# Patient Record
Sex: Male | Born: 1935 | Hispanic: Refuse to answer | Marital: Married | Smoking: Current every day smoker
Health system: Southern US, Community
[De-identification: ages and names within clinical notes are randomized; demographics above are authoritative.]

## PROBLEM LIST (undated history)

## (undated) DIAGNOSIS — Z9889 Other specified postprocedural states: Secondary | ICD-10-CM

## (undated) DIAGNOSIS — Z8679 Personal history of other diseases of the circulatory system: Secondary | ICD-10-CM

## (undated) DIAGNOSIS — E538 Deficiency of other specified B group vitamins: Secondary | ICD-10-CM

## (undated) DIAGNOSIS — J449 Chronic obstructive pulmonary disease, unspecified: Secondary | ICD-10-CM

## (undated) DIAGNOSIS — Z720 Tobacco use: Secondary | ICD-10-CM

## (undated) DIAGNOSIS — I10 Essential (primary) hypertension: Secondary | ICD-10-CM

## (undated) DIAGNOSIS — I509 Heart failure, unspecified: Secondary | ICD-10-CM

## (undated) HISTORY — PX: CHOLECYSTECTOMY: SHX55

## (undated) HISTORY — PX: PACEMAKER INSERTION: SHX728

---

## 2003-11-05 HISTORY — PX: OTHER SURGICAL HISTORY: SHX169

## 2004-07-18 ENCOUNTER — Other Ambulatory Visit: Payer: Self-pay

## 2008-01-03 ENCOUNTER — Ambulatory Visit: Payer: Self-pay | Admitting: Internal Medicine

## 2008-01-20 ENCOUNTER — Ambulatory Visit: Payer: Self-pay | Admitting: Internal Medicine

## 2008-02-03 ENCOUNTER — Ambulatory Visit: Payer: Self-pay | Admitting: Internal Medicine

## 2008-03-04 ENCOUNTER — Ambulatory Visit: Payer: Self-pay | Admitting: Internal Medicine

## 2008-10-25 ENCOUNTER — Ambulatory Visit: Payer: Self-pay | Admitting: Cardiology

## 2008-10-26 ENCOUNTER — Ambulatory Visit: Payer: Self-pay | Admitting: Cardiology

## 2009-04-11 ENCOUNTER — Ambulatory Visit: Payer: Self-pay | Admitting: Internal Medicine

## 2009-11-04 HISTORY — PX: ABDOMINAL AORTIC ANEURYSM REPAIR: SUR1152

## 2009-11-04 HISTORY — PX: OTHER SURGICAL HISTORY: SHX169

## 2010-04-19 ENCOUNTER — Ambulatory Visit: Payer: Self-pay | Admitting: Internal Medicine

## 2010-05-04 ENCOUNTER — Ambulatory Visit: Payer: Self-pay | Admitting: Vascular Surgery

## 2010-05-18 ENCOUNTER — Inpatient Hospital Stay: Payer: Self-pay | Admitting: Vascular Surgery

## 2010-06-28 ENCOUNTER — Ambulatory Visit: Payer: Self-pay | Admitting: Vascular Surgery

## 2010-07-04 ENCOUNTER — Ambulatory Visit: Payer: Self-pay | Admitting: Cardiovascular Disease

## 2010-08-22 ENCOUNTER — Ambulatory Visit: Payer: Self-pay | Admitting: Vascular Surgery

## 2010-08-22 ENCOUNTER — Ambulatory Visit: Payer: Self-pay | Admitting: Cardiology

## 2010-09-05 ENCOUNTER — Inpatient Hospital Stay: Payer: Self-pay | Admitting: Vascular Surgery

## 2012-03-11 ENCOUNTER — Ambulatory Visit: Payer: Self-pay | Admitting: Oncology

## 2013-11-04 HISTORY — PX: OTHER SURGICAL HISTORY: SHX169

## 2015-01-03 DIAGNOSIS — I2510 Atherosclerotic heart disease of native coronary artery without angina pectoris: Secondary | ICD-10-CM | POA: Diagnosis not present

## 2015-01-03 DIAGNOSIS — D693 Immune thrombocytopenic purpura: Secondary | ICD-10-CM | POA: Diagnosis not present

## 2015-01-03 DIAGNOSIS — M109 Gout, unspecified: Secondary | ICD-10-CM | POA: Diagnosis not present

## 2015-01-03 DIAGNOSIS — Z125 Encounter for screening for malignant neoplasm of prostate: Secondary | ICD-10-CM | POA: Diagnosis not present

## 2015-01-03 DIAGNOSIS — Z0000 Encounter for general adult medical examination without abnormal findings: Secondary | ICD-10-CM | POA: Diagnosis not present

## 2015-01-03 DIAGNOSIS — Z23 Encounter for immunization: Secondary | ICD-10-CM | POA: Diagnosis not present

## 2015-01-03 DIAGNOSIS — E782 Mixed hyperlipidemia: Secondary | ICD-10-CM | POA: Diagnosis not present

## 2015-01-03 DIAGNOSIS — Z720 Tobacco use: Secondary | ICD-10-CM | POA: Diagnosis not present

## 2015-01-03 DIAGNOSIS — I10 Essential (primary) hypertension: Secondary | ICD-10-CM | POA: Diagnosis not present

## 2015-01-05 DIAGNOSIS — I495 Sick sinus syndrome: Secondary | ICD-10-CM | POA: Diagnosis not present

## 2015-01-05 DIAGNOSIS — I6523 Occlusion and stenosis of bilateral carotid arteries: Secondary | ICD-10-CM | POA: Diagnosis not present

## 2015-01-05 DIAGNOSIS — I5022 Chronic systolic (congestive) heart failure: Secondary | ICD-10-CM | POA: Diagnosis not present

## 2015-01-05 DIAGNOSIS — I119 Hypertensive heart disease without heart failure: Secondary | ICD-10-CM | POA: Diagnosis not present

## 2015-05-30 DIAGNOSIS — I714 Abdominal aortic aneurysm, without rupture: Secondary | ICD-10-CM | POA: Diagnosis not present

## 2015-05-30 DIAGNOSIS — I25118 Atherosclerotic heart disease of native coronary artery with other forms of angina pectoris: Secondary | ICD-10-CM | POA: Diagnosis not present

## 2015-05-30 DIAGNOSIS — Z950 Presence of cardiac pacemaker: Secondary | ICD-10-CM | POA: Diagnosis not present

## 2015-05-30 DIAGNOSIS — I5022 Chronic systolic (congestive) heart failure: Secondary | ICD-10-CM | POA: Diagnosis not present

## 2015-05-30 DIAGNOSIS — I517 Cardiomegaly: Secondary | ICD-10-CM | POA: Diagnosis not present

## 2015-05-30 DIAGNOSIS — R05 Cough: Secondary | ICD-10-CM | POA: Diagnosis not present

## 2015-05-30 DIAGNOSIS — R093 Abnormal sputum: Secondary | ICD-10-CM | POA: Diagnosis not present

## 2015-06-09 DIAGNOSIS — I5022 Chronic systolic (congestive) heart failure: Secondary | ICD-10-CM | POA: Diagnosis not present

## 2015-06-09 DIAGNOSIS — I25118 Atherosclerotic heart disease of native coronary artery with other forms of angina pectoris: Secondary | ICD-10-CM | POA: Diagnosis not present

## 2015-06-15 DIAGNOSIS — I5022 Chronic systolic (congestive) heart failure: Secondary | ICD-10-CM | POA: Diagnosis not present

## 2015-06-15 DIAGNOSIS — I313 Pericardial effusion (noninflammatory): Secondary | ICD-10-CM | POA: Diagnosis not present

## 2015-06-15 DIAGNOSIS — I25118 Atherosclerotic heart disease of native coronary artery with other forms of angina pectoris: Secondary | ICD-10-CM | POA: Diagnosis not present

## 2015-06-21 DIAGNOSIS — I5022 Chronic systolic (congestive) heart failure: Secondary | ICD-10-CM | POA: Diagnosis not present

## 2015-06-21 DIAGNOSIS — I25118 Atherosclerotic heart disease of native coronary artery with other forms of angina pectoris: Secondary | ICD-10-CM | POA: Diagnosis not present

## 2015-06-21 DIAGNOSIS — I495 Sick sinus syndrome: Secondary | ICD-10-CM | POA: Diagnosis not present

## 2015-08-07 ENCOUNTER — Encounter: Payer: Self-pay | Admitting: Internal Medicine

## 2015-08-07 ENCOUNTER — Other Ambulatory Visit: Payer: Self-pay | Admitting: Internal Medicine

## 2015-08-07 DIAGNOSIS — M169 Osteoarthritis of hip, unspecified: Secondary | ICD-10-CM | POA: Insufficient documentation

## 2015-08-07 DIAGNOSIS — M109 Gout, unspecified: Secondary | ICD-10-CM | POA: Insufficient documentation

## 2015-08-07 DIAGNOSIS — D693 Immune thrombocytopenic purpura: Secondary | ICD-10-CM | POA: Insufficient documentation

## 2015-08-07 DIAGNOSIS — K220 Achalasia of cardia: Secondary | ICD-10-CM | POA: Insufficient documentation

## 2015-08-07 DIAGNOSIS — R3129 Other microscopic hematuria: Secondary | ICD-10-CM

## 2015-08-07 DIAGNOSIS — E785 Hyperlipidemia, unspecified: Secondary | ICD-10-CM

## 2015-08-07 DIAGNOSIS — F17200 Nicotine dependence, unspecified, uncomplicated: Secondary | ICD-10-CM | POA: Insufficient documentation

## 2015-08-07 DIAGNOSIS — I10 Essential (primary) hypertension: Secondary | ICD-10-CM | POA: Insufficient documentation

## 2015-08-07 DIAGNOSIS — D692 Other nonthrombocytopenic purpura: Secondary | ICD-10-CM | POA: Insufficient documentation

## 2015-08-07 DIAGNOSIS — I2510 Atherosclerotic heart disease of native coronary artery without angina pectoris: Secondary | ICD-10-CM | POA: Insufficient documentation

## 2015-09-15 ENCOUNTER — Other Ambulatory Visit: Payer: Self-pay | Admitting: Internal Medicine

## 2016-01-31 ENCOUNTER — Other Ambulatory Visit: Payer: Self-pay | Admitting: Internal Medicine

## 2016-02-22 ENCOUNTER — Other Ambulatory Visit: Payer: Self-pay | Admitting: Internal Medicine

## 2016-03-19 ENCOUNTER — Other Ambulatory Visit: Payer: Self-pay | Admitting: Internal Medicine

## 2016-09-18 ENCOUNTER — Ambulatory Visit (INDEPENDENT_AMBULATORY_CARE_PROVIDER_SITE_OTHER): Payer: Self-pay | Admitting: Vascular Surgery

## 2016-09-18 ENCOUNTER — Other Ambulatory Visit (INDEPENDENT_AMBULATORY_CARE_PROVIDER_SITE_OTHER): Payer: Self-pay

## 2016-09-18 ENCOUNTER — Encounter (INDEPENDENT_AMBULATORY_CARE_PROVIDER_SITE_OTHER): Payer: Self-pay

## 2017-07-17 ENCOUNTER — Other Ambulatory Visit: Payer: Self-pay | Admitting: Internal Medicine

## 2017-07-17 DIAGNOSIS — R1313 Dysphagia, pharyngeal phase: Secondary | ICD-10-CM

## 2017-08-18 ENCOUNTER — Ambulatory Visit: Payer: Self-pay | Attending: Internal Medicine

## 2017-11-19 ENCOUNTER — Ambulatory Visit
Admission: RE | Admit: 2017-11-19 | Discharge: 2017-11-19 | Disposition: A | Discharge status: Home / Self Care | Payer: Medicare Other | Source: Ambulatory Visit | Attending: Internal Medicine | Admitting: Internal Medicine

## 2017-11-19 ENCOUNTER — Other Ambulatory Visit: Payer: Self-pay | Admitting: Internal Medicine

## 2017-11-19 DIAGNOSIS — R600 Localized edema: Secondary | ICD-10-CM

## 2017-11-21 ENCOUNTER — Emergency Department: Payer: Medicare Other

## 2017-11-21 ENCOUNTER — Observation Stay: Payer: Medicare Other

## 2017-11-21 ENCOUNTER — Observation Stay
Admission: EM | Admit: 2017-11-21 | Discharge: 2017-11-22 | Disposition: A | Discharge status: Home / Self Care | Payer: Medicare Other | Attending: Internal Medicine | Admitting: Internal Medicine

## 2017-11-21 ENCOUNTER — Other Ambulatory Visit: Payer: Self-pay

## 2017-11-21 DIAGNOSIS — Z7982 Long term (current) use of aspirin: Secondary | ICD-10-CM | POA: Diagnosis not present

## 2017-11-21 DIAGNOSIS — M21332 Wrist drop, left wrist: Secondary | ICD-10-CM | POA: Insufficient documentation

## 2017-11-21 DIAGNOSIS — G5632 Lesion of radial nerve, left upper limb: Secondary | ICD-10-CM | POA: Diagnosis not present

## 2017-11-21 DIAGNOSIS — E43 Unspecified severe protein-calorie malnutrition: Secondary | ICD-10-CM | POA: Diagnosis not present

## 2017-11-21 DIAGNOSIS — M1610 Unilateral primary osteoarthritis, unspecified hip: Secondary | ICD-10-CM | POA: Insufficient documentation

## 2017-11-21 DIAGNOSIS — R29898 Other symptoms and signs involving the musculoskeletal system: Secondary | ICD-10-CM | POA: Insufficient documentation

## 2017-11-21 DIAGNOSIS — I110 Hypertensive heart disease with heart failure: Secondary | ICD-10-CM | POA: Insufficient documentation

## 2017-11-21 DIAGNOSIS — R531 Weakness: Secondary | ICD-10-CM | POA: Diagnosis present

## 2017-11-21 DIAGNOSIS — F0390 Unspecified dementia without behavioral disturbance: Secondary | ICD-10-CM | POA: Insufficient documentation

## 2017-11-21 DIAGNOSIS — Z79899 Other long term (current) drug therapy: Secondary | ICD-10-CM | POA: Diagnosis not present

## 2017-11-21 DIAGNOSIS — E119 Type 2 diabetes mellitus without complications: Secondary | ICD-10-CM | POA: Diagnosis not present

## 2017-11-21 DIAGNOSIS — I2510 Atherosclerotic heart disease of native coronary artery without angina pectoris: Secondary | ICD-10-CM | POA: Diagnosis not present

## 2017-11-21 DIAGNOSIS — Z950 Presence of cardiac pacemaker: Secondary | ICD-10-CM | POA: Diagnosis not present

## 2017-11-21 DIAGNOSIS — Z6822 Body mass index (BMI) 22.0-22.9, adult: Secondary | ICD-10-CM | POA: Insufficient documentation

## 2017-11-21 DIAGNOSIS — R200 Anesthesia of skin: Secondary | ICD-10-CM | POA: Insufficient documentation

## 2017-11-21 DIAGNOSIS — J449 Chronic obstructive pulmonary disease, unspecified: Secondary | ICD-10-CM | POA: Diagnosis not present

## 2017-11-21 DIAGNOSIS — R29701 NIHSS score 1: Secondary | ICD-10-CM | POA: Diagnosis not present

## 2017-11-21 DIAGNOSIS — E785 Hyperlipidemia, unspecified: Secondary | ICD-10-CM | POA: Diagnosis not present

## 2017-11-21 DIAGNOSIS — Z8249 Family history of ischemic heart disease and other diseases of the circulatory system: Secondary | ICD-10-CM | POA: Insufficient documentation

## 2017-11-21 DIAGNOSIS — I4892 Unspecified atrial flutter: Secondary | ICD-10-CM | POA: Diagnosis not present

## 2017-11-21 DIAGNOSIS — Z955 Presence of coronary angioplasty implant and graft: Secondary | ICD-10-CM | POA: Insufficient documentation

## 2017-11-21 DIAGNOSIS — Z794 Long term (current) use of insulin: Secondary | ICD-10-CM | POA: Insufficient documentation

## 2017-11-21 DIAGNOSIS — I639 Cerebral infarction, unspecified: Principal | ICD-10-CM

## 2017-11-21 HISTORY — DX: Chronic obstructive pulmonary disease, unspecified: J44.9

## 2017-11-21 HISTORY — DX: Deficiency of other specified B group vitamins: E53.8

## 2017-11-21 HISTORY — DX: Other specified postprocedural states: Z98.890

## 2017-11-21 HISTORY — DX: Personal history of other diseases of the circulatory system: Z86.79

## 2017-11-21 HISTORY — DX: Tobacco use: Z72.0

## 2017-11-21 HISTORY — DX: Heart failure, unspecified: I50.9

## 2017-11-21 HISTORY — DX: Essential (primary) hypertension: I10

## 2017-11-21 LAB — GLUCOSE, CAPILLARY
GLUCOSE-CAPILLARY: 110 mg/dL — AB (ref 65–99)
GLUCOSE-CAPILLARY: 91 mg/dL (ref 65–99)
GLUCOSE-CAPILLARY: 130 mg/dL — AB (ref 65–99)

## 2017-11-21 LAB — DIFFERENTIAL
BASOS ABS: 0 10*3/uL (ref 0–0.1)
BASOS PCT: 1 %
Eosinophils Absolute: 0.1 10*3/uL (ref 0–0.7)
Eosinophils Relative: 2 %
LYMPHS PCT: 17 %
Lymphs Abs: 0.6 10*3/uL — ABNORMAL LOW (ref 1.0–3.6)
MONOS PCT: 6 %
Monocytes Absolute: 0.2 10*3/uL (ref 0.2–1.0)
NEUTROS ABS: 2.7 10*3/uL (ref 1.4–6.5)
Neutrophils Relative %: 74 %

## 2017-11-21 LAB — COMPREHENSIVE METABOLIC PANEL
ALK PHOS: 88 U/L (ref 38–126)
ALT: 12 U/L — AB (ref 17–63)
AST: 26 U/L (ref 15–41)
Albumin: 3.2 g/dL — ABNORMAL LOW (ref 3.5–5.0)
Anion gap: 13 (ref 5–15)
BUN: 15 mg/dL (ref 6–20)
CALCIUM: 8.6 mg/dL — AB (ref 8.9–10.3)
CHLORIDE: 98 mmol/L — AB (ref 101–111)
CO2: 30 mmol/L (ref 22–32)
CREATININE: 1.17 mg/dL (ref 0.61–1.24)
GFR calc Af Amer: >60 mL/min (ref >60)
GFR calc non Af Amer: 55 mL/min — ABNORMAL LOW (ref >60)
Glucose, Bld: 119 mg/dL — ABNORMAL HIGH (ref 65–99)
Potassium: 3 mmol/L — ABNORMAL LOW (ref 3.5–5.1)
SODIUM: 141 mmol/L (ref 135–145)
Total Bilirubin: 1.4 mg/dL — ABNORMAL HIGH (ref 0.3–1.2)
Total Protein: 6.3 g/dL — ABNORMAL LOW (ref 6.5–8.1)

## 2017-11-21 LAB — HEMOGLOBIN A1C
Hgb A1c MFr Bld: 5.5 % (ref 4.8–5.6)
Mean Plasma Glucose: 111.15 mg/dL

## 2017-11-21 LAB — URINALYSIS, ROUTINE W REFLEX MICROSCOPIC
BACTERIA UA: NONE SEEN
Bilirubin Urine: NEGATIVE
Glucose, UA: NEGATIVE mg/dL
KETONES UR: NEGATIVE mg/dL
Leukocytes, UA: NEGATIVE
Nitrite: NEGATIVE
Protein, ur: NEGATIVE mg/dL
SQUAMOUS EPITHELIAL / LPF: NONE SEEN
Specific Gravity, Urine: 1.005 (ref 1.005–1.030)
pH: 7 (ref 5.0–8.0)

## 2017-11-21 LAB — CBC
HEMATOCRIT: 44.7 % (ref 40.0–52.0)
Hemoglobin: 14.8 g/dL (ref 13.0–18.0)
MCH: 31.6 pg (ref 26.0–34.0)
MCHC: 33.1 g/dL (ref 32.0–36.0)
MCV: 95.3 fL (ref 80.0–100.0)
PLATELETS: 86 10*3/uL — AB (ref 150–440)
RBC: 4.68 MIL/uL (ref 4.40–5.90)
RDW: 13.8 % (ref 11.5–14.5)
WBC: 3.7 10*3/uL — AB (ref 3.8–10.6)

## 2017-11-21 LAB — PROTIME-INR
INR: 1.01
Prothrombin Time: 13.2 seconds (ref 11.4–15.2)

## 2017-11-21 LAB — TROPONIN I: Troponin I: <0.03 ng/mL (ref <0.03)

## 2017-11-21 LAB — APTT: APTT: 27 s (ref 24–36)

## 2017-11-21 MED ORDER — GABAPENTIN 300 MG PO CAPS
300.0000 mg | ORAL_CAPSULE | Freq: Two times a day (BID) | ORAL | Status: DC
  Administered 2017-11-21 – 2017-11-22 (×3): 300 mg via ORAL
  Filled 2017-11-21 (×3): qty 1

## 2017-11-21 MED ORDER — ADULT MULTIVITAMIN W/MINERALS CH
1.0000 | ORAL_TABLET | Freq: Every day | ORAL | Status: DC
  Administered 2017-11-22: 1 via ORAL
  Filled 2017-11-21: qty 1

## 2017-11-21 MED ORDER — INSULIN ASPART 100 UNIT/ML ~~LOC~~ SOLN: 0.0000 [IU] | Freq: Every day | SUBCUTANEOUS | Status: DC

## 2017-11-21 MED ORDER — ENOXAPARIN SODIUM 40 MG/0.4ML ~~LOC~~ SOLN
40.0000 mg | SUBCUTANEOUS | Status: DC
  Administered 2017-11-21: 40 mg via SUBCUTANEOUS
  Filled 2017-11-21: qty 0.4

## 2017-11-21 MED ORDER — VITAMIN C 500 MG PO TABS
500.0000 mg | ORAL_TABLET | Freq: Two times a day (BID) | ORAL | Status: DC
  Administered 2017-11-22: 500 mg via ORAL
  Filled 2017-11-21 (×3): qty 1

## 2017-11-21 MED ORDER — TRAMADOL HCL 50 MG PO TABS
50.0000 mg | ORAL_TABLET | Freq: Four times a day (QID) | ORAL | Status: DC | PRN
  Administered 2017-11-22: 50 mg via ORAL
  Filled 2017-11-21: qty 1

## 2017-11-21 MED ORDER — INSULIN ASPART 100 UNIT/ML ~~LOC~~ SOLN: 0.0000 [IU] | Freq: Three times a day (TID) | SUBCUTANEOUS | Status: DC

## 2017-11-21 MED ORDER — ACETAMINOPHEN 325 MG PO TABS: 650.0000 mg | ORAL_TABLET | ORAL | Status: DC | PRN

## 2017-11-21 MED ORDER — ENSURE ENLIVE PO LIQD
237.0000 mL | Freq: Two times a day (BID) | ORAL | Status: DC
  Administered 2017-11-22: 237 mL via ORAL

## 2017-11-21 MED ORDER — ALLOPURINOL 300 MG PO TABS
300.0000 mg | ORAL_TABLET | Freq: Every day | ORAL | Status: DC
  Administered 2017-11-21 – 2017-11-22 (×2): 300 mg via ORAL
  Filled 2017-11-21 (×2): qty 1

## 2017-11-21 MED ORDER — POTASSIUM CHLORIDE CRYS ER 20 MEQ PO TBCR
40.0000 meq | EXTENDED_RELEASE_TABLET | ORAL | Status: AC
  Administered 2017-11-21 (×2): 40 meq via ORAL
  Filled 2017-11-21 (×2): qty 2

## 2017-11-21 MED ORDER — PRAVASTATIN SODIUM 40 MG PO TABS
80.0000 mg | ORAL_TABLET | Freq: Every day | ORAL | Status: DC
  Administered 2017-11-21 – 2017-11-22 (×2): 80 mg via ORAL
  Filled 2017-11-21 (×2): qty 2

## 2017-11-21 MED ORDER — ASPIRIN 81 MG PO CHEW
81.0000 mg | CHEWABLE_TABLET | Freq: Every day | ORAL | Status: DC
  Administered 2017-11-21 – 2017-11-22 (×2): 81 mg via ORAL
  Filled 2017-11-21 (×2): qty 1

## 2017-11-21 MED ORDER — FUROSEMIDE 40 MG PO TABS
40.0000 mg | ORAL_TABLET | Freq: Every day | ORAL | Status: DC
  Administered 2017-11-21 – 2017-11-22 (×2): 40 mg via ORAL
  Filled 2017-11-21 (×2): qty 1

## 2017-11-21 MED ORDER — ACETAMINOPHEN 650 MG RE SUPP: 650.0000 mg | RECTAL | Status: DC | PRN

## 2017-11-21 MED ORDER — DONEPEZIL HCL 5 MG PO TABS
10.0000 mg | ORAL_TABLET | Freq: Every day | ORAL | Status: DC
  Administered 2017-11-21: 10 mg via ORAL
  Filled 2017-11-21: qty 2
  Filled 2017-11-21: qty 1

## 2017-11-21 MED ORDER — STROKE: EARLY STAGES OF RECOVERY BOOK
Freq: Once | Status: AC
  Administered 2017-11-21: 17:00:00

## 2017-11-21 MED ORDER — MEMANTINE HCL 10 MG PO TABS
10.0000 mg | ORAL_TABLET | Freq: Two times a day (BID) | ORAL | Status: DC
  Administered 2017-11-21 – 2017-11-22 (×3): 10 mg via ORAL
  Filled 2017-11-21 (×4): qty 1

## 2017-11-21 MED ORDER — ACETAMINOPHEN 160 MG/5ML PO SOLN
650.0000 mg | ORAL | Status: DC | PRN
  Filled 2017-11-21: qty 20.3

## 2017-11-21 NOTE — Consult Note (Signed)
Reason for Consult:LUE weakness    CC: LUE weakness   HPI: Bobby Francis is an 84 y.o. male  With hx of HTN, smoking [facility] for b/l lower extremity edema. Comes in today with LUE weakness since 9AM. NIHSS is 1. No other deficits.    History reviewed. No pertinent past medical history.  Past Surgical History:  Procedure Laterality Date  . ABDOMINAL AORTIC ANEURYSM REPAIR  2011  . Aortoiliac femoral bypass  2011  . CHOLECYSTECTOMY    . Lumbar back surgery  2015  . PACEMAKER INSERTION     For a flutter and sick sinus syndrome  . PTCA and stent  2005    Family History  Problem Relation Age of Onset  . Cervical cancer Mother     Social History:  reports that he has been smoking.  He does not have any smokeless tobacco history on file. He reports that he does not drink alcohol. His drug history is not on file.  No Known Allergies  Medications: I have reviewed the patient's current medications.  ROS: History obtained from the patient  General ROS: negative for - chills, fatigue, fever, night sweats, weight gain or weight loss Psychological ROS: negative for - behavioral disorder, hallucinations, memory difficulties, mood swings or suicidal ideation Ophthalmic ROS: negative for - blurry vision, double vision, eye pain or loss of vision ENT ROS: negative for - epistaxis, nasal discharge, oral lesions, sore throat, tinnitus or vertigo Allergy and Immunology ROS: negative for - hives or itchy/watery eyes Hematological and Lymphatic ROS: negative for - bleeding problems, bruising or swollen lymph nodes Endocrine ROS: negative for - galactorrhea, hair pattern changes, polydipsia/polyuria or temperature intolerance Respiratory ROS: negative for - cough, hemoptysis, shortness of breath or wheezing Cardiovascular ROS: negative for - chest pain, dyspnea on exertion, edema or irregular heartbeat Gastrointestinal ROS: negative for - abdominal pain, diarrhea,  hematemesis, nausea/vomiting or stool incontinence Genito-Urinary ROS: negative for - dysuria, hematuria, incontinence or urinary frequency/urgency Musculoskeletal ROS: negative for - joint swelling or muscular weakness Neurological ROS: as noted in HPI Dermatological ROS: negative for rash and skin lesion changes  Physical Examination: Blood pressure (!) 101/52, pulse 76, temperature (!) 97.1 F (36.2 C), temperature source Oral, resp. rate 17, height 5\' 8"  (1.727 m), weight 165 lb (74.8 kg), SpO2 94 %.   Neurological Examination   Mental Status: Alert, oriented, thought content appropriate.  Speech fluent without evidence of aphasia.  Able to follow 3 step commands without difficulty. Cranial Nerves: II: Discs flat bilaterally; Visual fields grossly normal, pupils equal, round, reactive to light and accommodation III,IV, VI: ptosis not present, extra-ocular motions intact bilaterally V,VII: smile symmetric, facial light touch sensation normal bilaterally VIII: hearing normal bilaterally IX,X: gag reflex present XI: bilateral shoulder shrug XII: midline tongue extension Motor: Right : Upper extremity   5/5 Proximally distally 4/5 and L wrist drop.    Left:     Upper extremity   5/5  Lower extremity   5/5     Lower extremity   5/5 Tone and bulk:normal tone throughout; no atrophy noted Sensory: Pinprick and light touch intact throughout, bilaterally Deep Tendon Reflexes: 1+ and symmetric throughout Plantars: Right: downgoing   Left: downgoing Cerebellar: normal finger-to-nose, normal rapid alternating movements and normal heel-to-shin test Gait: not tested      Laboratory Studies:   Basic Metabolic Panel: No results for input(s): NA, K, CL, CO2, GLUCOSE, BUN, CREATININE, CALCIUM, MG, PHOS in the last 168  hours.  Liver Function Tests: No results for input(s): AST, ALT, ALKPHOS, BILITOT, PROT, ALBUMIN in the last 168 hours. No results for input(s): LIPASE, AMYLASE in the last  168 hours. No results for input(s): AMMONIA in the last 168 hours.  CBC: No results for input(s): WBC, NEUTROABS, HGB, HCT, MCV, PLT in the last 168 hours.  Cardiac Enzymes: No results for input(s): CKTOTAL, CKMB, CKMBINDEX, TROPONINI in the last 168 hours.  BNP: Invalid input(s): POCBNP  CBG: No results for input(s): GLUCAP in the last 168 hours.  Microbiology: No results found for this or any previous visit.  Coagulation Studies: No results for input(s): LABPROT, INR in the last 72 hours.  Urinalysis: No results for input(s): COLORURINE, LABSPEC, PHURINE, GLUCOSEU, HGBUR, BILIRUBINUR, KETONESUR, PROTEINUR, UROBILINOGEN, NITRITE, LEUKOCYTESUR in the last 168 hours.  Invalid input(s): APPERANCEUR  Lipid Panel:  No results found for: CHOL, TRIG, HDL, CHOLHDL, VLDL, LDLCALC  HgbA1C: No results found for: HGBA1C  Urine Drug Screen:  No results found for: LABOPIA, COCAINSCRNUR, LABBENZ, AMPHETMU, THCU, LABBARB  Alcohol Level: No results for input(s): ETH in the last 168 hours.  Other results: EKG: normal EKG, normal sinus rhythm, unchanged from previous tracings.  Imaging: US Venous Img Lower Unilateral Left  Result Date: 11/19/2017 CLINICAL DATA:  Lower extremity edema EXAM: LEFT LOWER EXTREMITY VENOUS DUPLEX ULTRASOUND TECHNIQUE: Doppler venous assessment of the left lower extremity deep venous system was performed, including characterization of spectral flow, compressibility, and phasicity. COMPARISON:  None. FINDINGS: There is complete compressibility of the left common femoral, femoral, and popliteal veins. Doppler analysis demonstrates respiratory phasicity and augmentation of flow with calf compression. No obvious superficial vein or calf vein thrombosis. IMPRESSION: No evidence of left lower extremity DVT. Electronically Signed   By: Jolaine Click M.D.   On: 11/19/2017 14:33   Ct Head Code Stroke Wo Contrast  Result Date: 11/21/2017 CLINICAL DATA:  Code stroke. Acute  onset of left-sided weakness and ataxia beginning 2 hours ago. EXAM: CT HEAD WITHOUT CONTRAST TECHNIQUE: Contiguous axial images were obtained from the base of the skull through the vertex without intravenous contrast. COMPARISON:  None. FINDINGS: Brain: Moderate generalized atrophy and white matter disease is present bilaterally. The basal ganglia are intact. The insular ribbon is normal. No acute or focal cortical abnormality is present bilaterally. No acute hemorrhage or mass lesion is present. The ventricles are proportionate to the degree of atrophy. No significant extra-axial fluid collection is present. Bilateral remote lacunar infarcts are present in the cerebellum. No acute infarct is present. The brainstem is within normal limits. Vascular: Dense atherosclerotic calcifications are present within the cavernous internal carotid arteries bilaterally. Calcifications are also present at the dural margin of both vertebral arteries. There is no hyperdense vessel. Skull: The calvarium is intact. No focal lytic or blastic lesions are present. Sinuses/Orbits: The paranasal sinuses and mastoid air cells are clear. Globes and orbits are within normal limits. ASPECTS [facility] Stroke Program Early CT Score) - Ganglionic level infarction (caudate, lentiform nuclei, internal capsule, insula, M1-M3 cortex): 7/7 - Supraganglionic infarction (M4-M6 cortex): 3/3 Total score (0-10 with 10 being normal): 10/10 IMPRESSION: 1. No acute intracranial abnormality. 2. Moderate diffuse atrophy and white matter disease likely reflects the sequela of chronic microvascular ischemia. 3. Extensive atherosclerotic changes without a hyperdense vessel. 4. ASPECTS is 10/10 These results were called by telephone at the time of interpretation on 11/21/2017 at 11:41 am to Dr. Gwinda Passe, who verbally acknowledged these results. Electronically Signed   By: Cristal Deer  Mattern M.D.   On: 11/21/2017 11:43     Assessment/Plan:  84 y.o. male   With hx of HTN, smoking last seen in [facility] yesterday for b/l lower extremity edema. Comes in today with LUE weakness since 9AM. NIHSS is 1. No other deficits.   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  MRI brain/MRA ASA please change to ASA 81 daily rather then 3x week Pt/ot  11/21/2017, 11:56 AM

## 2017-11-21 NOTE — Progress Notes (Signed)
Chaplain was paged for code stroke. Chaplain reported to charged and entered room . Pt was alert and conversational  Nurses were at the bedside and wife was in the room. Chaplain talked with both and offered prayer. Wife said yes and Chaplain prayed . Chaplain let them know he was available as needed . Chaplain reported to the nurse that he had completed task    11/21/17 1200  Clinical Encounter Type  Visited With Patient and family together  Visit Type Initial;Spiritual support  Referral From Care management  Spiritual Encounters  Spiritual Needs Prayer;Emotional  Stress Factors  Patient Stress Factors Health changes  Family Stress Factors Health changes

## 2017-11-21 NOTE — Evaluation (Signed)
Physical Therapy Evaluation Patient Details Name: Bobby Francis MRN: [id number] DOB: 1929-04-08 Today's Date: 11/21/2017   History of Present Illness  84 y.o. male with a known history of hypertension, diabetes, COPD, CHF, atrial flutter with pacemaker presents to the emergency room complaining of acute onset of left arm numbness and weakness  Clinical Impression  PT was able to easily get to sitting EOB, standing and circumambulate the nurses' station w/o safety issues.  His biggest issue is L wrist/finger extension which is essentially flaccid, otherwise he is generally weak but symmetrical with R&L U/LEs.  Pt was able to walk >150 ft w/o AD and did well with no safety issues and no fatigue.  Pt had no facial asymmetry, issues with vision/tracking, etc though he did have issues coordinating functional finger opposition due to weakness (did not appear to be a coordination issue).  Pt does appear to have radial nerve involvement, reports he has no fallen on his elbow or otherwise done something to injury the area.  Pt is functional to return home w/o further PT intervention, OT does seem to be appropriate per their assessment.    Follow Up Recommendations No PT follow up(outpatient OT seems most appropriate?)    Equipment Recommendations  Rolling walker with 5" wheels(if his walker at home is indeed standard (no wheels))    Recommendations for Other Services       Precautions / Restrictions Precautions Precautions: Fall Restrictions Weight Bearing Restrictions: No      Mobility  Bed Mobility Overal bed mobility: Modified Independent                Transfers Overall transfer level: Modified independent Equipment used: Rolling walker (2 wheeled)             General transfer comment: Pt able to rise w/o assist, showed good confidence and safety  Ambulation/Gait Ambulation/Gait assistance: Modified independent (Device/Increase time) Ambulation Distance (Feet): 200  Feet Assistive device: Rolling walker (2 wheeled);None       General Gait Details: Pt with consistent cadence and slow speed.  He used the walker for the first 30 ft and was able to walk the rest of the way w/o AD.  Pt with no LOBs, no stagger steps, minimal fatigue.  Ultimately he did well.   Stairs            Wheelchair Mobility    Modified Rankin (Stroke Patients Only)       Balance Overall balance assessment: Modified Independent                                           Pertinent Vitals/Pain Pain Assessment: No/denies pain    Home Living Family/patient expects to be discharged to:: Private residence Living Arrangements: Spouse/significant other             Home Equipment: Environmental consultant - standard(pt does not think his walker has wheels)      Prior Function Level of Independence: Independent with assistive device(s)         Comments: Pt reports his wife runs most errands, but that he can/does     Hand Dominance        Extremity/Trunk Assessment   Upper Extremity Assessment Upper Extremity Assessment: Generalized weakness(only major asymetric defecit is L wrist extension flaccidity)    Lower Extremity Assessment Lower Extremity Assessment: Overall WFL for tasks assessed;Generalized weakness(L foot has  large blister on dorsum)       Communication   Communication: No difficulties  Cognition Arousal/Alertness: Awake/alert Behavior During Therapy: WFL for tasks assessed/performed Overall Cognitive Status: Difficult to assess                                 General Comments: Pt with occasional episodes where he appears to have some confusion.  Generally able to hold conversation appropritely.      General Comments      Exercises     Assessment/Plan    PT Assessment Patent does not need any further PT services  PT Problem List         PT Treatment Interventions      PT Goals (Current goals can be found in  the Care Plan section)  Acute Rehab PT Goals Patient Stated Goal: go home as soon as possible PT Goal Formulation: All assessment and education complete, DC therapy    Frequency     Barriers to discharge        Co-evaluation               AM-PAC PT "6 Clicks" Daily Activity  Outcome Measure Difficulty turning over in bed (including adjusting bedclothes, sheets and blankets)?: None Difficulty moving from lying on back to sitting on the side of the bed? : None Difficulty sitting down on and standing up from a chair with arms (e.g., wheelchair, bedside commode, etc,.)?: None Help needed moving to and from a bed to chair (including a wheelchair)?: None Help needed walking in hospital room?: None Help needed climbing 3-5 steps with a railing? : None 6 Click Score: 24    End of Session Equipment Utilized During Treatment: Gait belt Activity Tolerance: Patient tolerated treatment well Patient left: with bed alarm set;with call bell/phone within reach Nurse Communication: Mobility status PT Visit Diagnosis: Muscle weakness (generalized) (M62.81)    Time: [id number] PT Time Calculation (min) (ACUTE ONLY): 26 min   Charges:   PT Evaluation $PT Eval Low Complexity: 1 Low     PT G Codes:        Bobby Francis, DPT 11/21/2017, 5:18 PM

## 2017-11-21 NOTE — Progress Notes (Signed)
Initial Nutrition Assessment  DOCUMENTATION CODES:   Severe malnutrition in context of chronic illness  INTERVENTION:   Ensure Enlive po BID, each supplement provides 350 kcal and 20 grams of protein  Magic cup TID with meals, each supplement provides 290 kcal and 9 grams of protein  MVI daily  Vitamin C '500mg'$  po BID  Recommend SLP evaluation; will downgrade to a dysphagia 3 for now  Recommend monitor K, Mg, and P labs  NUTRITION DIAGNOSIS:   Severe Malnutrition related to dysphagia, other (see comment)(dementia, advanced age) as evidenced by 10 percent weight loss in 2 months, severe fat depletion, severe muscle depletion.  GOAL:   Patient will meet greater than or equal to 90% of their needs  MONITOR:   PO intake, Supplement acceptance, Labs, I & O's, Weight trends, Skin  REASON FOR ASSESSMENT:   Malnutrition Screening Tool    ASSESSMENT:   84 y.o. male with a known history of hypertension, diabetes, COPD, CHF, atrial flutter with pacemaker presents to the emergency room complaining of acute onset of left arm numbness and weakness.    Met with pt and family in room today. Pt is a poor historian so history obtained from pt's wife at bedside. Pt's wife reports pt with poor appetite and oral intake at baseline. She reports that pt has been having trouble chewing and swallowing foods over the past few years and this has been getting progressively worse. Pt reports that food will get stuck in the back of his throat; wife reports that she has had to give him the heimlich maneuver several times. Pt also reports sore, bleeding gums and loose fitting dentures. Pt eats a chopped diet at home. Per chart, pt has lost 16lbs(10%) that wife reports has occurred over the past 2 months; this is significant wt loss. Pt does not drink supplements at home or take a daily MVI. Pt noted to have severe bruising on his bilateral arms and petechial bruising on his bilateral legs. Pt is at high risk  for scurvy given his chronic smoking/tobacco use and poor oral intake. Recommend vitamin C and MVI supplementation. RD will order supplements and downgrade pt to a dysphagia 3 diet. Recommend SLP evaluation. Pt likely at refeeding risk; recommend monitor K, Mg, and P labs. Pt with low potassium today; this is being supplemented.        Medications reviewed and include: allopurinol, aspirin, lovenox, lasix, insulin, KCl   Labs reviewed: K 3.0(L), Cl 98(L), Ca 8.6(L) adj. 9.24 wnl, alb 3.2(L), tbili 1.4(H) Wbc- 3.7(L)  Nutrition-Focused physical exam completed. Findings are severe fat depletions in arms and chest, moderate to severe muscle depletions over entire body, and no edema. Pt noted to have severe bruising on bilateral arms, petechial bruising on bilateral legs, and reports bleeding gums.   Diet Order:  Diet Carb Modified Fluid consistency: Thin; Room service appropriate? Yes  EDUCATION NEEDS:   Education needs have been addressed  Skin:   Reviewed RN Assessment  Last BM:  PTA  Height:   Ht Readings from Last 1 Encounters:  11/21/17 '5\' 8"'$  (1.727 m)    Weight:   Wt Readings from Last 1 Encounters:  11/21/17 149 lb 12.8 oz (67.9 kg)    Ideal Body Weight:  70 kg  BMI:  Body mass index is 22.78 kg/m.  Estimated Nutritional Needs:   Kcal:  1900-2200kcal/day   Protein:  75-73g/day   Fluid:  >1.9L/day   Koleen Distance MS, RD, LDN Pager #[phone] After Hours  Pager: [phone]  

## 2017-11-21 NOTE — ED Provider Notes (Signed)
[facility] Emergency Department Provider Note  Time seen: 11:45 AM  I have reviewed the triage vital signs and the nursing notes.   HISTORY  Chief Complaint Code Stroke    HPI Bobby Francis is a 84 y.o. male with a past medical history of CAD, hypertension, hyperlipidemia, significant tobacco use, presents to the emergency department for left-sided weakness.  According to the patient he woke up around 6:00 this morning feeling normal.  Patient was able to use his walker to ambulate to the bathroom at around 9:00 this morning he called for his wife in the bathroom and states he was not feeling well.  He describes a fuzzy feeling in his head but denies any headache or head pain.  States he was unable to use his left arm.  States he feels the arm but is not able to use it.  No history of CVA previously.  Denies any anticoagulation.  Largely negative review of systems.  Does have a cough but states this is chronic.   No past medical history on file.  Patient Active Problem List   Diagnosis Date Noted  . CAD in native artery 08/07/2015  . Chronic idiopathic thrombocytopenic purpura (HCC) 08/07/2015  . Achalasia of the cricopharyngeus muscle 08/07/2015  . Essential (primary) hypertension 08/07/2015  . Controlled gout 08/07/2015  . Microscopic hematuria 08/07/2015  . Hyperlipidemia 08/07/2015  . Degenerative arthritis of hip 08/07/2015  . Senile purpura (HCC) 08/07/2015  . Compulsive tobacco user syndrome 08/07/2015     Prior to Admission medications   Medication Sig Start Date End Date Taking? Authorizing Provider  allopurinol (ZYLOPRIM) 300 MG tablet Take 1 tablet by mouth daily. 01/27/15   [provider]  amLODipine (NORVASC) 5 MG tablet Take 1 tablet by mouth daily. 01/27/15   [provider]  gabapentin (NEURONTIN) 300 MG capsule TAKE ONE CAPSULE BY MOUTH TWICE DAILY 08/07/15   Reubin MilanBerglund, Laura H, MD  gabapentin (NEURONTIN) 300 MG capsule  Take 1 capsule by mouth 2 (two) times daily. 01/03/15   [provider]  lisinopril (PRINIVIL,ZESTRIL) 40 MG tablet Take 1 tablet by mouth daily. 03/24/15   [provider]  pravastatin (PRAVACHOL) 80 MG tablet Take 1 tablet by mouth daily. 08/02/14   [provider]    Allergies no known allergies  Family History  Problem Relation Age of Onset  . Cervical cancer Mother     Social History Social History   Tobacco Use  . Smoking status: Current Every Day Smoker  Substance Use Topics  . Alcohol use: No    Alcohol/week: 0.0 oz  . Drug use: Not on file    Review of Systems Constitutional: Negative for fever. Eyes: Negative for visual complaints ENT: Negative for recent illness/congestion Cardiovascular: Negative for chest pain. Respiratory: Negative for shortness of breath.  Positive for cough, chronic per patient. Gastrointestinal: Negative for abdominal pain, vomiting and diarrhea. Genitourinary: Negative for urinary compaints Musculoskeletal: Left arm weakness Skin: Negative for skin complaints  Neurological: "Fuzzy" feeling in his head.  Denies headache.  Left arm weakness. All other ROS negative  ____________________________________________   PHYSICAL EXAM:  VITAL SIGNS: ED Triage Vitals [11/21/17 1128]  Enc Vitals Group     BP (!) 101/52     Pulse Rate 76     Resp 17     Temp (!) 97.1 F (36.2 C)     Temp Source Oral     SpO2 94 %     Weight 165  lb (74.8 kg)     Height 5\' 8"  (1.727 m)     Head Circumference      Peak Flow      Pain Score      Pain Loc      Pain Edu?      Excl. in GC?    Constitutional: Alert and oriented. Well appearing and in no distress. Eyes: Normal exam ENT   Head: Normocephalic and atraumatic.   Mouth/Throat: Mucous membranes are moist. Cardiovascular: Normal rate, regular rhythm. Respiratory: Normal respiratory effort without tachypnea nor retractions. Breath sounds are clear  Gastrointestinal:  Soft and nontender. No distention.  Musculoskeletal: Nontender with normal range of motion in all extremities.  Patient has moderate edema to left lower extremity with slight tenderness.  Wife states ultrasound performed yesterday negative for blood clot. Neurologic:  Normal speech and language.  Patient has diminished grip strength in left upper extremity, left upper extremity pronator drift.  Normal left lower extremity.  No sensory deficits.  No cranial nerve deficits. Skin:  Skin is warm, dry.  Large blister to the dorsal aspect of the left foot.  Clear fluid. Psychiatric: Mood and affect are normal.   ____________________________________________    EKG  EKG reviewed and interpreted by myself shows a paced rhythm at 74 bpm with a widened QRS, largely normal intervals with nonspecific ST changes.  ____________________________________________    RADIOLOGY  CT scan of the head shows no acute abnormality  ____________________________________________   INITIAL IMPRESSION / ASSESSMENT AND PLAN / ED COURSE  Pertinent labs & imaging results that were available during my care of the patient were reviewed by me and considered in my medical decision making (see chart for details).  Patient presents to the emergency department for left arm weakness.  He states around 9:00 this morning he developed acute weakness in the left upper extremity.  Patient continues to have significant weakness left upper extremity.  Differential this time would include CVA, TIA, ICH.  A code stroke has been initiated.  CT scan of the head is read as negative for acute abnormality per radiologist.  Labs are pending.  NIH Stroke Scale   Interval: Baseline Time: 11:48 AM Person Administering Scale: Minna Antis  Administer stroke scale items in the order listed. Record performance in each category after each subscale exam. Do not go back and change scores. Follow directions provided for each exam technique.  Scores should reflect what the patient does, not what the clinician thinks the patient can do. The clinician should record answers while administering the exam and work quickly. Except where indicated, the patient should not be coached (i.e., repeated requests to patient to make a special effort).   1a  Level of consciousness: 0=alert; keenly responsive  1b. LOC questions:  0=Performs both tasks correctly  1c. LOC commands: 0=Performs both tasks correctly  2.  Best Gaze: 0=normal  3.  Visual: 0=No visual loss  4. Facial Palsy: 0=Normal symmetric movement  5a.  Motor left arm: 2=Some effort against gravity, limb cannot get to or maintain (if cured) 90 (or 45) degrees, drifts down to bed, but has some effort against gravity  5b.  Motor right arm: 0=No drift, limb holds 90 (or 45) degrees for full 10 seconds  6a. motor left leg: 0=No drift, limb holds 90 (or 45) degrees for full 10 seconds  6b  Motor right leg:  0=No drift, limb holds 90 (or 45) degrees for full 10 seconds  7. Limb Ataxia: 1=Present  in one limb  8.  Sensory: 0=Normal; no sensory loss  9. Best Language:  0=No aphasia, normal  10. Dysarthria: 0=Normal  11. Extinction and Inattention: 0=No abnormality  12. Distal motor function: 1=At least some extension after 5 seconds, but is not fully extended. Any movement of the fingers which is not in response to a command is not scored   Total:   4   Patient has been seen by neurology, they recommend admission to the hospital but did not recommend TPA at this time.  CRITICAL CARE Performed by: Minna Antis   Total critical care time: 30 minutes  Critical care time was exclusive of separately billable procedures and treating other patients.  Critical care was necessary to treat or prevent imminent or life-threatening deterioration.  Critical care was time spent personally by me on the following activities: development of treatment plan with patient and/or surrogate as well as  nursing, discussions with consultants, evaluation of patient's response to treatment, examination of patient, obtaining history from patient or surrogate, ordering and performing treatments and interventions, ordering and review of laboratory studies, ordering and review of radiographic studies, pulse oximetry and re-evaluation of patient's condition.  ____________________________________________   FINAL CLINICAL IMPRESSION(S) / ED DIAGNOSES  CVA Left arm weakness    Minna Antis, MD 11/21/17 1233

## 2017-11-21 NOTE — H&P (Addendum)
[facility] [place] [facility]   PATIENT NAME: Bobby Francis    MR#:  [id number]  DATE OF BIRTH:  [birth year]  DATE OF ADMISSION:  11/21/2017  PRIMARY CARE PHYSICIAN: Mickey Farber, MD   REQUESTING/REFERRING PHYSICIAN: Dr. Lenard Lance  CHIEF COMPLAINT:   Chief Complaint  Patient presents with  . Code Stroke    HISTORY OF PRESENT ILLNESS:  Bobby Francis  is a 84 y.o. male with a known history of hypertension, diabetes, COPD, CHF, atrial flutter with pacemaker presents to the emergency room complaining of acute onset of left arm numbness and weakness.  More pronounced in his left hand.  Seen by neurology through code stroke and advised admission for further workup.  Patient smokes.  No history of stroke.  Not on any anticoagulation. No other focal weakness or trouble swallowing.  No change in speech.  Has mild dementia. History mainly obtained from wife. Outside TPA window at this time.  No DP advised by neurology.  PAST MEDICAL HISTORY:   Past Medical History:  Diagnosis Date  . B12 deficiency   . CHF (congestive heart failure) (HCC)   . COPD (chronic obstructive pulmonary disease) (HCC)   . HTN (hypertension)   . S/P AAA repair   . Tobacco abuse     PAST SURGICAL HISTORY:   Past Surgical History:  Procedure Laterality Date  . ABDOMINAL AORTIC ANEURYSM REPAIR  2011  . Aortoiliac femoral bypass  2011  . CHOLECYSTECTOMY    . Lumbar back surgery  2015  . PACEMAKER INSERTION     For a flutter and sick sinus syndrome  . PTCA and stent  2005    SOCIAL HISTORY:   Social History   Tobacco Use  . Smoking status: Current Every Day Smoker  Substance Use Topics  . Alcohol use: No    Alcohol/week: 0.0 oz    FAMILY HISTORY:   Family History  Problem Relation Age of Onset  . Cervical cancer Mother   . CAD Brother     DRUG ALLERGIES:  No Known Allergies  REVIEW OF SYSTEMS:   Review of Systems  Constitutional: Positive for malaise/fatigue.  Negative for chills and fever.  HENT: Negative for sore throat.   Eyes: Negative for blurred vision, double vision and pain.  Respiratory: Positive for shortness of breath. Negative for cough, hemoptysis and wheezing.   Cardiovascular: Positive for leg swelling. Negative for chest pain, palpitations and orthopnea.  Gastrointestinal: Negative for abdominal pain, constipation, diarrhea, heartburn, nausea and vomiting.  Genitourinary: Negative for dysuria and hematuria.  Musculoskeletal: Negative for back pain and joint pain.  Skin: Negative for rash.  Neurological: Positive for sensory change and focal weakness. Negative for speech change and headaches.  Endo/Heme/Allergies: Does not bruise/bleed easily.  Psychiatric/Behavioral: Negative for depression. The patient is not nervous/anxious.     MEDICATIONS AT HOME:   Prior to Admission medications   Medication Sig Start Date End Date Taking? Authorizing Provider  allopurinol (ZYLOPRIM) 300 MG tablet Take 1 tablet by mouth daily. 01/27/15  Yes [provider]  aspirin 81 MG chewable tablet Chew 1 tablet by mouth daily.   Yes [provider]  donepezil (ARICEPT) 10 MG tablet Take 10 mg by mouth at bedtime.   Yes [provider]  furosemide (LASIX) 40 MG tablet Take 40 mg by mouth daily.   Yes [provider]  gabapentin (NEURONTIN) 300 MG capsule TAKE ONE CAPSULE BY MOUTH TWICE DAILY 08/07/15  Yes Reubin Milan, MD  losartan (COZAAR) 50 MG tablet Take 50 mg by mouth daily.   Yes [provider]  memantine (NAMENDA) 10 MG tablet Take 10 mg by mouth 2 (two) times daily.   Yes [provider]  pravastatin (PRAVACHOL) 80 MG tablet Take 1 tablet by mouth daily. 08/02/14  Yes [provider]  traMADol (ULTRAM) 50 MG tablet Take 50 mg by mouth every 6 (six) hours as needed. 11/03/17  Yes [provider]     VITAL SIGNS:  Blood pressure (!) 101/52, pulse 76, temperature (!) 97.1  F (36.2 C), temperature source Oral, resp. rate 17, height 5\' 8"  (1.727 m), weight 74.8 kg (165 lb), SpO2 94 %.  PHYSICAL EXAMINATION:  Physical Exam  GENERAL:  84 y.o.-year-old patient lying in the bed with no acute distress.  EYES: Pupils equal, round, reactive to light and accommodation. No scleral icterus. Extraocular muscles intact.  HEENT: Head atraumatic, normocephalic. Oropharynx and nasopharynx clear. No oropharyngeal erythema, moist oral mucosa  NECK:  Supple, no jugular venous distention. No thyroid enlargement, no tenderness.  LUNGS: Normal breath sounds bilaterally, no wheezing, rales, rhonchi. No use of accessory muscles of respiration.  CARDIOVASCULAR: S1, S2 normal. No murmurs, rubs, or gallops.  ABDOMEN: Soft, nontender, nondistended. Bowel sounds present. No organomegaly or mass.  EXTREMITIES: Bilateral lower extremity edema. Large blister on dorsum of left foot NEUROLOGIC: Cranial nerves II through XII are intact.  Motor strength 4/5 in right upper extremity with significant weakness of handgrip.  Right upper extremity and lower extremity strength is 5/5. PSYCHIATRIC: The patient is alert and awake SKIN: No obvious rash, lesion, or ulcer.   LABORATORY PANEL:   CBC No results for input(s): WBC, HGB, HCT, PLT in the last 168 hours. ------------------------------------------------------------------------------------------------------------------  Chemistries  No results for input(s): NA, K, CL, CO2, GLUCOSE, BUN, CREATININE, CALCIUM, MG, AST, ALT, ALKPHOS, BILITOT in the last 168 hours.  Invalid input(s): GFRCGP ------------------------------------------------------------------------------------------------------------------  Cardiac Enzymes No results for input(s): TROPONINI in the last 168 hours. ------------------------------------------------------------------------------------------------------------------  RADIOLOGY:  US Venous Img Lower Unilateral  Left  Result Date: 11/19/2017 CLINICAL DATA:  Lower extremity edema EXAM: LEFT LOWER EXTREMITY VENOUS DUPLEX ULTRASOUND TECHNIQUE: Doppler venous assessment of the left lower extremity deep venous system was performed, including characterization of spectral flow, compressibility, and phasicity. COMPARISON:  None. FINDINGS: There is complete compressibility of the left common femoral, femoral, and popliteal veins. Doppler analysis demonstrates respiratory phasicity and augmentation of flow with calf compression. No obvious superficial vein or calf vein thrombosis. IMPRESSION: No evidence of left lower extremity DVT. Electronically Signed   By: Jolaine Click M.D.   On: 11/19/2017 14:33   Ct Head Code Stroke Wo Contrast  Result Date: 11/21/2017 CLINICAL DATA:  Code stroke. Acute onset of left-sided weakness and ataxia beginning 2 hours ago. EXAM: CT HEAD WITHOUT CONTRAST TECHNIQUE: Contiguous axial images were obtained from the base of the skull through the vertex without intravenous contrast. COMPARISON:  None. FINDINGS: Brain: Moderate generalized atrophy and white matter disease is present bilaterally. The basal ganglia are intact. The insular ribbon is normal. No acute or focal cortical abnormality is present bilaterally. No acute hemorrhage or mass lesion is present. The ventricles are proportionate to the degree of atrophy. No significant extra-axial fluid collection is present. Bilateral remote lacunar infarcts are present in the cerebellum. No acute infarct is present. The brainstem is within normal limits. Vascular: Dense atherosclerotic calcifications are present within the cavernous internal carotid arteries bilaterally. Calcifications are also present at the  dural margin of both vertebral arteries. There is no hyperdense vessel. Skull: The calvarium is intact. No focal lytic or blastic lesions are present. Sinuses/Orbits: The paranasal sinuses and mastoid air cells are clear. Globes and orbits are  within normal limits. ASPECTS [facility]([place] Stroke Program Early CT Score) - Ganglionic level infarction (caudate, lentiform nuclei, internal capsule, insula, M1-M3 cortex): 7/7 - Supraganglionic infarction (M4-M6 cortex): 3/3 Total score (0-10 with 10 being normal): 10/10 IMPRESSION: 1. No acute intracranial abnormality. 2. Moderate diffuse atrophy and white matter disease likely reflects the sequela of chronic microvascular ischemia. 3. Extensive atherosclerotic changes without a hyperdense vessel. 4. ASPECTS is 10/10 These results were called by telephone at the time of interpretation on 11/21/2017 at 11:41 am to Dr. Gwinda PassePadachowski, who verbally acknowledged these results. Electronically Signed   By: Marin Robertshristopher  Mattern M.D.   On: 11/21/2017 11:43     IMPRESSION AND PLAN:   *Acute left upper extremity weakness.  Could be radial palsy or CVA.  Will admit under observation.  Cannot do MRI due to pacemaker.  Will check echocardiogram and carotid Dopplers.  CT head can be repeated in 24 hours.  Aspirin, statin.  Neurochecks.  PT OT and speech consult.  Check fasting lipid profile  *Diabetes mellitus.  Sliding scale insulin and home meds.  *Hypertension.  Hold medications for permissive hypertension.  *Mild dementia.  Watch for inpatient delirium.  *Left foot bulla No pain or erythema.  Likely due to fluid overload.  Would monitor.  *DVT prophylaxis with Lovenox  All the records are reviewed and case discussed with ED provider. Management plans discussed with the patient, family and they are in agreement.  CODE STATUS: Full code  TOTAL TIME TAKING CARE OF THIS PATIENT: 40 minutes.   Orie FishermanSrikar R Cory Rama M.D on 11/21/2017 at 12:21 PM  Between 7am to 6pm - Pager - [phone]  After 6pm go to [email or web address] - password [email or web address] [facility]  [facility]  Office  [phone]  CC: Primary care physician; Mickey Farberhies, David, MD  Note: This dictation was prepared with Dragon dictation along with  smaller phrase technology. Any transcriptional errors that result from this process are unintentional.

## 2017-11-21 NOTE — ED Triage Notes (Signed)
Pt woke up in the morning feeling ok. Napped on recliner and then got up around 0900 to use bathroom. Limpness/numbness in left arm. Pt seen here yesterday for ultrasound in foot due to blister. History of dementia. No slurred speech.

## 2017-11-22 ENCOUNTER — Observation Stay
Admit: 2017-11-22 | Discharge: 2017-11-22 | Disposition: A | Discharge status: Home / Self Care | Payer: Medicare Other | Attending: Internal Medicine | Admitting: Internal Medicine

## 2017-11-22 ENCOUNTER — Observation Stay: Payer: Medicare Other

## 2017-11-22 DIAGNOSIS — G5632 Lesion of radial nerve, left upper limb: Secondary | ICD-10-CM

## 2017-11-22 DIAGNOSIS — E43 Unspecified severe protein-calorie malnutrition: Secondary | ICD-10-CM

## 2017-11-22 LAB — LIPID PANEL
CHOL/HDL RATIO: 2.8 ratio
CHOLESTEROL: 117 mg/dL (ref 0–200)
HDL: 42 mg/dL (ref >40)
LDL Cholesterol: 64 mg/dL (ref 0–99)
Triglycerides: 55 mg/dL (ref <150)
VLDL: 11 mg/dL (ref 0–40)

## 2017-11-22 LAB — ECHOCARDIOGRAM COMPLETE
Height: 68 in
Weight: 2403.2 oz

## 2017-11-22 LAB — GLUCOSE, CAPILLARY
GLUCOSE-CAPILLARY: 139 mg/dL — AB (ref 65–99)
Glucose-Capillary: 75 mg/dL (ref 65–99)

## 2017-11-22 NOTE — Progress Notes (Signed)
Bobby Francis to be D/C'd Home per MD order.  Discussed prescriptions and follow up appointments with the patient.  medication list explained in detail. Pt verbalized understanding.  Allergies as of 11/22/2017   No Known Allergies     Medication List    TAKE these medications   allopurinol 300 MG tablet Commonly known as:  ZYLOPRIM Take 1 tablet by mouth daily.   aspirin 81 MG chewable tablet Chew 1 tablet by mouth daily.   donepezil 10 MG tablet Commonly known as:  ARICEPT Take 10 mg by mouth at bedtime.   furosemide 40 MG tablet Commonly known as:  LASIX Take 40 mg by mouth daily.   gabapentin 300 MG capsule Commonly known as:  NEURONTIN TAKE ONE CAPSULE BY MOUTH TWICE DAILY   losartan 50 MG tablet Commonly known as:  COZAAR Take 50 mg by mouth daily.   memantine 10 MG tablet Commonly known as:  NAMENDA Take 10 mg by mouth 2 (two) times daily.   pravastatin 80 MG tablet Commonly known as:  PRAVACHOL Take 1 tablet by mouth daily.   traMADol 50 MG tablet Commonly known as:  ULTRAM Take 50 mg by mouth every 6 (six) hours as needed. Notes to patient:  Last taken 1/19 at 1:30 am       Vitals:   11/22/17 0751 11/22/17 0932  BP: (!) 146/73 (!) 143/67  Pulse: 75 72  Resp: 17 18  Temp: 97.6 F (36.4 C) 97.7 F (36.5 C)  SpO2: 98% 96%    Skin clean, dry and intact without evidence of skin break down, no evidence of skin tears noted. IV catheter discontinued intact. Site without signs and symptoms of complications. Dressing and pressure applied. Pt denies pain at this time. No complaints noted.  An After Visit Summary was printed and given to the patient. Patient escorted via WC, and D/C home via private auto.  Jenia Klepper Marylou FlesherM Neha Waight

## 2017-11-22 NOTE — Evaluation (Signed)
Clinical/Bedside Swallow Evaluation Patient Details  Name: Bobby Francis MRN: [id number] Date of Birth: [birth year]  Today's Date: 11/22/2017 Time: SLP Start Time (ACUTE ONLY): 1000 SLP Stop Time (ACUTE ONLY): 1038 SLP Time Calculation (min) (ACUTE ONLY): 38 min  Past Medical History:  Past Medical History:  Diagnosis Date  . B12 deficiency   . CHF (congestive heart failure) (HCC)   . COPD (chronic obstructive pulmonary disease) (HCC)   . HTN (hypertension)   . S/P AAA repair   . Tobacco abuse    Past Surgical History:  Past Surgical History:  Procedure Laterality Date  . ABDOMINAL AORTIC ANEURYSM REPAIR  2011  . Aortoiliac femoral bypass  2011  . CHOLECYSTECTOMY    . Lumbar back surgery  2015  . PACEMAKER INSERTION     For a flutter and sick sinus syndrome  . PTCA and stent  2005   HPI: Per History and Physical:    Bobby PrudeCharles E Cedrone is a 84 y.o. male with a past medical history of CAD, hypertension, hyperlipidemia, significant tobacco use, presents to the emergency department for left-sided weakness.  According to the patient he woke up around 6:00 this morning feeling normal.  Patient was able to use his walker to ambulate to the bathroom at around 9:00 this morning he called for his wife in the bathroom and states he was not feeling well.  He describes a fuzzy feeling in his head but denies any headache or head pain.  States he was unable to use his left arm.  States he feels the arm but is not able to use it.  No history of CVA previously.    Assessment / Plan / Recommendation Clinical Impression   Pt presents with functional swallowing abilities at bedside with no overt s/s of aspiration. Reviewed and appreciate RD notes. Wife has reported that pt often choked with food to the point that she has had to give him the heimlich maneuver once. Pt is frustrated and reports his wife is "exaggerating". Today, Pt tolerated all consistencies without difficulty. Pt reports he often  doesn't chew his food thoroughly and that's why he gets choked. Adequate mastication of solid foods with minimal oral residue after the swallow. Vocal quality remained clear, laryngeal elevation appeared adequate. Rec continue with Dys 3 diet with thin liquids. Pt agreed to slow down and chew foods thoroughly. ST to follow with toleration of diet and downgrade to chopped if needed. SLP Visit Diagnosis: Dysphagia, oropharyngeal phase (R13.12)    Aspiration Risk     mild  Diet Recommendation Dysphagia 3 (Mech soft)   Liquid Administration via: Cup;Straw Medication Administration: Whole meds with liquid    Other  Recommendations     Follow up Recommendations        Frequency and Duration    1 week       Prognosis    Good    Swallow Study   General Type of Study: Bedside Swallow Evaluation Temperature Spikes Noted: No History of Recent Intubation: No Behavior/Cognition: Alert;Agitated Oral Cavity Assessment: Within Functional Limits Oral Care Completed by SLP: Yes Oral Cavity - Dentition: Dentures, top;Dentures, bottom Self-Feeding Abilities: Able to feed self Patient Positioning: Upright in bed Baseline Vocal Quality: Normal Volitional Cough: Strong Volitional Swallow: Able to elicit    Oral/Motor/Sensory Function Overall Oral Motor/Sensory Function: Within functional limits   Ice Chips Presentation: Cup   Thin Liquid Thin Liquid: Within functional limits Presentation: Cup;Spoon;Straw    Nectar Thick     Honey  Thick     Puree Puree: Within functional limits Presentation: Self Fed   Solid   GO   Solid: Within functional limits Presentation: Self Fed        Eather Colas 11/22/2017,10:38 AM

## 2017-11-22 NOTE — Progress Notes (Signed)
*  PRELIMINARY RESULTS* Echocardiogram 2D Echocardiogram has been performed.  Bobby Francis 11/22/2017, 11:46 AM

## 2017-11-22 NOTE — Consult Note (Signed)
No change in exam. Pt still has L wrist drop.    Past Medical History:  Diagnosis Date  . B12 deficiency   . CHF (congestive heart failure) (HCC)   . COPD (chronic obstructive pulmonary disease) (HCC)   . HTN (hypertension)   . S/P AAA repair   . Tobacco abuse     Past Surgical History:  Procedure Laterality Date  . ABDOMINAL AORTIC ANEURYSM REPAIR  2011  . Aortoiliac femoral bypass  2011  . CHOLECYSTECTOMY    . Lumbar back surgery  2015  . PACEMAKER INSERTION     For a flutter and sick sinus syndrome  . PTCA and stent  2005    Family History  Problem Relation Age of Onset  . Cervical cancer Mother   . CAD Brother     Social History:  reports that he has been smoking.  he has never used smokeless tobacco. He reports that he does not drink alcohol. His drug history is not on file.  No Known Allergies  Medications: I have reviewed the patient's current medications.  ROS: History obtained from the patient  General ROS: negative for - chills, fatigue, fever, night sweats, weight gain or weight loss Psychological ROS: negative for - behavioral disorder, hallucinations, memory difficulties, mood swings or suicidal ideation Ophthalmic ROS: negative for - blurry vision, double vision, eye pain or loss of vision ENT ROS: negative for - epistaxis, nasal discharge, oral lesions, sore throat, tinnitus or vertigo Allergy and Immunology ROS: negative for - hives or itchy/watery eyes Hematological and Lymphatic ROS: negative for - bleeding problems, bruising or swollen lymph nodes Endocrine ROS: negative for - galactorrhea, hair pattern changes, polydipsia/polyuria or temperature intolerance Respiratory ROS: negative for - cough, hemoptysis, shortness of breath or wheezing Cardiovascular ROS: negative for - chest pain, dyspnea on exertion, edema or irregular heartbeat Gastrointestinal ROS: negative for - abdominal pain, diarrhea, hematemesis, nausea/vomiting or stool  incontinence Genito-Urinary ROS: negative for - dysuria, hematuria, incontinence or urinary frequency/urgency Musculoskeletal ROS: negative for - joint swelling or muscular weakness Neurological ROS: as noted in HPI Dermatological ROS: negative for rash and skin lesion changes  Physical Examination: Blood pressure (!) 143/67, pulse 72, temperature 97.7 F (36.5 C), temperature source Oral, resp. rate 18, height 5\' 8"  (1.727 m), weight 150 lb 3.2 oz (68.1 kg), SpO2 96 %.   Neurological Examination   Mental Status: Alert, oriented, thought content appropriate.  Speech fluent without evidence of aphasia.  Able to follow 3 step commands without difficulty. Cranial Nerves: II: Discs flat bilaterally; Visual fields grossly normal, pupils equal, round, reactive to light and accommodation III,IV, VI: ptosis not present, extra-ocular motions intact bilaterally V,VII: smile symmetric, facial light touch sensation normal bilaterally VIII: hearing normal bilaterally IX,X: gag reflex present XI: bilateral shoulder shrug XII: midline tongue extension Motor: Right : Upper extremity   5/5 Proximally distally 4/5 and L wrist drop.    Left:     Upper extremity   5/5  Lower extremity   5/5     Lower extremity   5/5 Tone and bulk:normal tone throughout; no atrophy noted Sensory: Pinprick and light touch intact throughout, bilaterally Deep Tendon Reflexes: 1+ and symmetric throughout Plantars: Right: downgoing   Left: downgoing Cerebellar: normal finger-to-nose, normal rapid alternating movements and normal heel-to-shin test Gait: not tested      Laboratory Studies:   Basic Metabolic Panel: Recent Labs  Lab 11/21/17 1146  NA 141  K 3.0*  CL 98*  CO2 30  GLUCOSE 119*  BUN 15  CREATININE 1.17  CALCIUM 8.6*    Liver Function Tests: Recent Labs  Lab 11/21/17 1146  AST 26  ALT 12*  ALKPHOS 88  BILITOT 1.4*  PROT 6.3*  ALBUMIN 3.2*   No results for input(s): LIPASE, AMYLASE in the  last 168 hours. No results for input(s): AMMONIA in the last 168 hours.  CBC: Recent Labs  Lab 11/21/17 1146  WBC 3.7*  NEUTROABS 2.7  HGB 14.8  HCT 44.7  MCV 95.3  PLT 86*    Cardiac Enzymes: Recent Labs  Lab 11/21/17 1146  TROPONINI <0.03    BNP: Invalid input(s): POCBNP  CBG: Recent Labs  Lab 11/21/17 1151 11/21/17 1723 11/21/17 2037 11/22/17 0753 11/22/17 1222  GLUCAP 110* 91 130* 75 139*    Microbiology: No results found for this or any previous visit.  Coagulation Studies: Recent Labs    11/21/17 1146  LABPROT 13.2  INR 1.01    Urinalysis:  Recent Labs  Lab 11/21/17 1146  COLORURINE STRAW*  LABSPEC 1.005  PHURINE 7.0  GLUCOSEU NEGATIVE  HGBUR SMALL*  BILIRUBINUR NEGATIVE  KETONESUR NEGATIVE  PROTEINUR NEGATIVE  NITRITE NEGATIVE  LEUKOCYTESUR NEGATIVE    Lipid Panel:     Component Value Date/Time   CHOL 117 11/22/2017 0507   TRIG 55 11/22/2017 0507   HDL 42 11/22/2017 0507   CHOLHDL 2.8 11/22/2017 0507   VLDL 11 11/22/2017 0507   LDLCALC 64 11/22/2017 0507    HgbA1C:  Lab Results  Component Value Date   HGBA1C 5.5 11/21/2017    Urine Drug Screen:  No results found for: LABOPIA, COCAINSCRNUR, LABBENZ, AMPHETMU, THCU, LABBARB  Alcohol Level: No results for input(s): ETH in the last 168 hours.  Other results: EKG: normal EKG, normal sinus rhythm, unchanged from previous tracings.  Imaging: [place] Carotid Bilateral (at [facility] Only)  Result Date: 11/21/2017 CLINICAL DATA:  CVA EXAM: BILATERAL CAROTID DUPLEX ULTRASOUND TECHNIQUE: Wallace CullensGray scale imaging, color Doppler and duplex ultrasound were performed of bilateral carotid and vertebral arteries in the neck. COMPARISON:  None. FINDINGS: Criteria: Quantification of carotid stenosis is based on velocity parameters that correlate the residual internal carotid diameter with NASCET-based stenosis levels, using the diameter of the distal internal carotid lumen as the denominator for  stenosis measurement. The following velocity measurements were obtained: RIGHT ICA:  189 cm/sec CCA:  79 cm/sec SYSTOLIC ICA/CCA RATIO:  2.4 DIASTOLIC ICA/CCA RATIO:  2.5 ECA:  68 cm/sec LEFT ICA:  65 cm/sec CCA:  56 cm/sec SYSTOLIC ICA/CCA RATIO:  1.2 DIASTOLIC ICA/CCA RATIO:  2.5 ECA:  55 cm/sec RIGHT CAROTID ARTERY: There is moderate mixed plaque in the mid and upper common carotid artery. There is also moderate irregular mixed plaque in the bulb. Low resistance internal carotid Doppler pattern is preserved. RIGHT VERTEBRAL ARTERY:  Antegrade. LEFT CAROTID ARTERY: There is extensive irregular calcified and soft plaque in the mid and upper common carotid artery. There is also moderate irregular mixed plaque in the bulb. Low resistance internal carotid Doppler pattern. LEFT VERTEBRAL ARTERY:  Antegrade. IMPRESSION: There is 50-69% stenosis in the right internal carotid artery. Based on internal carotid systolic velocity measurements, there is less than 50% stenosis in the left internal carotid artery. There is extensive irregular plaque in the left common carotid artery and significant narrowing within the common carotid artery cannot be excluded. Electronically Signed   By: Jolaine ClickArthur  Hoss M.D.   On: 11/21/2017 14:49   Ct Head Code Stroke Wo  Contrast  Result Date: 11/21/2017 CLINICAL DATA:  Code stroke. Acute onset of left-sided weakness and ataxia beginning 2 hours ago. EXAM: CT HEAD WITHOUT CONTRAST TECHNIQUE: Contiguous axial images were obtained from the base of the skull through the vertex without intravenous contrast. COMPARISON:  None. FINDINGS: Brain: Moderate generalized atrophy and white matter disease is present bilaterally. The basal ganglia are intact. The insular ribbon is normal. No acute or focal cortical abnormality is present bilaterally. No acute hemorrhage or mass lesion is present. The ventricles are proportionate to the degree of atrophy. No significant extra-axial fluid collection is  present. Bilateral remote lacunar infarcts are present in the cerebellum. No acute infarct is present. The brainstem is within normal limits. Vascular: Dense atherosclerotic calcifications are present within the cavernous internal carotid arteries bilaterally. Calcifications are also present at the dural margin of both vertebral arteries. There is no hyperdense vessel. Skull: The calvarium is intact. No focal lytic or blastic lesions are present. Sinuses/Orbits: The paranasal sinuses and mastoid air cells are clear. Globes and orbits are within normal limits. ASPECTS [facility] Stroke Program Early CT Score) - Ganglionic level infarction (caudate, lentiform nuclei, internal capsule, insula, M1-M3 cortex): 7/7 - Supraganglionic infarction (M4-M6 cortex): 3/3 Total score (0-10 with 10 being normal): 10/10 IMPRESSION: 1. No acute intracranial abnormality. 2. Moderate diffuse atrophy and white matter disease likely reflects the sequela of chronic microvascular ischemia. 3. Extensive atherosclerotic changes without a hyperdense vessel. 4. ASPECTS is 10/10 These results were called by telephone at the time of interpretation on 11/21/2017 at 11:41 am to Dr. Gwinda Passe, who verbally acknowledged these results. Electronically Signed   By: Marin Roberts M.D.   On: 11/21/2017 11:43     Assessment/Plan:  84 y.o. male  With hx of HTN, smoking last seen in [facility] yesterday for b/l lower extremity edema. Comes in today with LUE weakness since 9AM. NIHSS is 1. No other deficits.   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  - CTH repeat no change - exam similar and I suspect he has L radial nerve palsy  - d/c planning - if continues EEG as out pt - unable to get MRI due to PPM  11/22/2017, 12:46 PM

## 2017-11-22 NOTE — Evaluation (Signed)
Occupational Therapy Evaluation Patient Details Name: Bobby Francis MRN: [id number] DOB: 1928/11/14 Today's Date: 11/22/2017    History of Present Illness 84 y.o. male with a known history of hypertension, diabetes, COPD, CHF, atrial flutter with pacemaker presents to the emergency room complaining of acute onset of left arm numbness and weakness   Clinical Impression   Patient seen for OT evaluation this date.  Patient lives at home with his wife in a mobile home.  He presents with left UE wrist drop, sensation appears intact, muscle weakness, decreased ability to perform self care tasks.  Patient would benefit from skilled OT to maximize safety and independence in daily tasks.  Recommend HHOT upon discharge.  If patient does not qualify for home health, he may require OP OT to further address LUE deficits.     Follow Up Recommendations  Home health OT    Equipment Recommendations       Recommendations for Other Services       Precautions / Restrictions Precautions Precautions: Fall Restrictions Weight Bearing Restrictions: No      Mobility Bed Mobility Overal bed mobility: Modified Independent                Transfers Overall transfer level: Modified independent Equipment used: Rolling walker (2 wheeled)                  Balance Overall balance assessment: Modified Independent                                         ADL either performed or assessed with clinical judgement   ADL Overall ADL's : Needs assistance/impaired Eating/Feeding: Minimal assistance Eating/Feeding Details (indicate cue type and reason): right hand only, he will require assist with cutting food, holding plate Grooming: Standing Grooming Details (indicate cue type and reason): right hand use         Upper Body Dressing : Modified independent Upper Body Dressing Details (indicate cue type and reason): no buttons performed Lower Body Dressing: Minimal  assistance Lower Body Dressing Details (indicate cue type and reason): difficulty with holding sock to don, difficulty with pulling up pants over hips after toileting.  Toilet Transfer: Modified Stage manager and Hygiene: Minimal assistance         General ADL Comments: Patient does not feel he needs to be in the hospital, tends to minimize his deficits in left hand.       Vision Baseline Vision/History: Wears glasses Wears Glasses: Reading only       Perception     Praxis      Pertinent Vitals/Pain Pain Assessment: No/denies pain     Hand Dominance Right   Extremity/Trunk Assessment Upper Extremity Assessment Upper Extremity Assessment: LUE deficits/detail LUE Deficits / Details: left UE with wrist drop, holds wrist in flexion 30 degrees, able to perform thumb flexion no thumb extension, no wrist extension actively, decreased ability to perform oppositional movements. Shoulder and elbow within normal limits. Sensation intact.  LUE Coordination: decreased fine motor;decreased gross motor   Lower Extremity Assessment Lower Extremity Assessment: Defer to PT evaluation       Communication Communication Communication: No difficulties   Cognition Arousal/Alertness: Awake/alert Behavior During Therapy: WFL for tasks assessed/performed Overall Cognitive Status: Within Functional Limits for tasks assessed  General Comments       Exercises     Shoulder Instructions      Home Living Family/patient expects to be discharged to:: Private residence Living Arrangements: Spouse/significant other Available Help at Discharge: Family Type of Home: Mobile home Home Access: Stairs to enter Secretary/administratorntrance Stairs-Number of Steps: 4 Entrance Stairs-Rails: Left;Right Home Layout: One level     Bathroom Shower/Tub: IT trainerTub/shower unit;Curtain   Bathroom Toilet: Standard Bathroom Accessibility: Yes    Home Equipment: Environmental consultantWalker - standard          Prior Functioning/Environment Level of Independence: Independent with assistive device(s)        Comments: Pt reports his wife runs most errands, but that he can/does        OT Problem List: Decreased strength;Decreased knowledge of use of DME or AE;Impaired UE functional use;Decreased range of motion;Decreased coordination      OT Treatment/Interventions: DME and/or AE instruction;Therapeutic activities;Self-care/ADL training;Therapeutic exercise;Neuromuscular education;Patient/family education    OT Goals(Current goals can be found in the care plan section) Acute Rehab OT Goals Patient Stated Goal: go home as soon as possible OT Goal Formulation: With patient Time For Goal Achievement: 12/04/17 Potential to Achieve Goals: Good ADL Goals Pt Will Perform Lower Body Dressing: (P) with modified independence  OT Frequency: Min 2X/week   Barriers to D/C:            Co-evaluation              AM-PAC PT "6 Clicks" Daily Activity     Outcome Measure Help from another person eating meals?: A Little Help from another person taking care of personal grooming?: A Little Help from another person toileting, which includes using toliet, bedpan, or urinal?: A Little Help from another person bathing (including washing, rinsing, drying)?: A Little Help from another person to put on and taking off regular upper body clothing?: A Little Help from another person to put on and taking off regular lower body clothing?: A Little 6 Click Score: 18   End of Session Equipment Utilized During Treatment: Rolling walker;Gait belt  Activity Tolerance: Patient tolerated treatment well Patient left: in bed;with call bell/phone within reach;with bed alarm set  OT Visit Diagnosis: Muscle weakness (generalized) (M62.81)                Time: [id number] OT Time Calculation (min): 25 min Charges:  OT General Charges $OT Visit: 1 Visit OT  Evaluation $OT Eval Low Complexity: 1 Low OT Treatments $Self Care/Home Management : 8-22 mins G-Codes: OT G-codes **NOT FOR INPATIENT CLASS** Functional Assessment Tool Used: AM-PAC 6 Clicks Daily Activity Functional Limitation: Self care  Amy T Lovett, OTR/L, CLT   Lovett,Amy 11/22/2017, 3:22 PM

## 2017-11-27 ENCOUNTER — Emergency Department: Payer: Medicare Other

## 2017-11-27 ENCOUNTER — Emergency Department
Admission: EM | Admit: 2017-11-27 | Discharge: 2017-11-27 | Disposition: A | Discharge status: Home / Self Care | Payer: Medicare Other | Attending: Emergency Medicine | Admitting: Emergency Medicine

## 2017-11-27 ENCOUNTER — Encounter: Payer: Self-pay | Admitting: Emergency Medicine

## 2017-11-27 DIAGNOSIS — I509 Heart failure, unspecified: Secondary | ICD-10-CM | POA: Insufficient documentation

## 2017-11-27 DIAGNOSIS — L03116 Cellulitis of left lower limb: Secondary | ICD-10-CM | POA: Diagnosis not present

## 2017-11-27 DIAGNOSIS — I110 Hypertensive heart disease with heart failure: Secondary | ICD-10-CM | POA: Insufficient documentation

## 2017-11-27 DIAGNOSIS — Y998 Other external cause status: Secondary | ICD-10-CM | POA: Insufficient documentation

## 2017-11-27 DIAGNOSIS — Z950 Presence of cardiac pacemaker: Secondary | ICD-10-CM | POA: Diagnosis not present

## 2017-11-27 DIAGNOSIS — J449 Chronic obstructive pulmonary disease, unspecified: Secondary | ICD-10-CM | POA: Insufficient documentation

## 2017-11-27 DIAGNOSIS — X58XXXA Exposure to other specified factors, initial encounter: Secondary | ICD-10-CM | POA: Insufficient documentation

## 2017-11-27 DIAGNOSIS — S90822A Blister (nonthermal), left foot, initial encounter: Secondary | ICD-10-CM | POA: Insufficient documentation

## 2017-11-27 DIAGNOSIS — F17200 Nicotine dependence, unspecified, uncomplicated: Secondary | ICD-10-CM | POA: Insufficient documentation

## 2017-11-27 DIAGNOSIS — I2510 Atherosclerotic heart disease of native coronary artery without angina pectoris: Secondary | ICD-10-CM | POA: Insufficient documentation

## 2017-11-27 DIAGNOSIS — Y939 Activity, unspecified: Secondary | ICD-10-CM | POA: Insufficient documentation

## 2017-11-27 DIAGNOSIS — I724 Aneurysm of artery of lower extremity: Secondary | ICD-10-CM

## 2017-11-27 DIAGNOSIS — Y929 Unspecified place or not applicable: Secondary | ICD-10-CM | POA: Diagnosis not present

## 2017-11-27 LAB — CBC WITH DIFFERENTIAL/PLATELET
Basophils Absolute: 0 10*3/uL (ref 0–0.1)
Basophils Relative: 1 %
Eosinophils Absolute: 0.1 10*3/uL (ref 0–0.7)
Eosinophils Relative: 3 %
HEMATOCRIT: 41.8 % (ref 40.0–52.0)
HEMOGLOBIN: 13.8 g/dL (ref 13.0–18.0)
LYMPHS ABS: 0.9 10*3/uL — AB (ref 1.0–3.6)
LYMPHS PCT: 17 %
MCH: 31.5 pg (ref 26.0–34.0)
MCHC: 32.9 g/dL (ref 32.0–36.0)
MCV: 95.6 fL (ref 80.0–100.0)
Monocytes Absolute: 0.5 10*3/uL (ref 0.2–1.0)
Monocytes Relative: 9 %
NEUTROS ABS: 3.8 10*3/uL (ref 1.4–6.5)
NEUTROS PCT: 70 %
Platelets: 96 10*3/uL — ABNORMAL LOW (ref 150–440)
RBC: 4.38 MIL/uL — AB (ref 4.40–5.90)
RDW: 14.5 % (ref 11.5–14.5)
WBC: 5.4 10*3/uL (ref 3.8–10.6)

## 2017-11-27 LAB — COMPREHENSIVE METABOLIC PANEL
ALT: 15 U/L — ABNORMAL LOW (ref 17–63)
ANION GAP: 8 (ref 5–15)
AST: 30 U/L (ref 15–41)
Albumin: 3.3 g/dL — ABNORMAL LOW (ref 3.5–5.0)
Alkaline Phosphatase: 93 U/L (ref 38–126)
BUN: 16 mg/dL (ref 6–20)
CHLORIDE: 98 mmol/L — AB (ref 101–111)
CO2: 33 mmol/L — AB (ref 22–32)
Calcium: 8.8 mg/dL — ABNORMAL LOW (ref 8.9–10.3)
Creatinine, Ser: 1.3 mg/dL — ABNORMAL HIGH (ref 0.61–1.24)
GFR, EST AFRICAN AMERICAN: 56 mL/min — AB (ref >60)
GFR, EST NON AFRICAN AMERICAN: 48 mL/min — AB (ref >60)
Glucose, Bld: 126 mg/dL — ABNORMAL HIGH (ref 65–99)
POTASSIUM: 3.4 mmol/L — AB (ref 3.5–5.1)
SODIUM: 139 mmol/L (ref 135–145)
Total Bilirubin: 1 mg/dL (ref 0.3–1.2)
Total Protein: 6.5 g/dL (ref 6.5–8.1)

## 2017-11-27 MED ORDER — TRAMADOL HCL 50 MG PO TABS
50.0000 mg | ORAL_TABLET | ORAL | Status: AC
  Administered 2017-11-27: 50 mg via ORAL
  Filled 2017-11-27: qty 1

## 2017-11-27 MED ORDER — CLINDAMYCIN HCL 300 MG PO CAPS: 300.0000 mg | ORAL_CAPSULE | Freq: Three times a day (TID) | ORAL | 0 refills | Status: AC

## 2017-11-27 MED ORDER — SILVER SULFADIAZINE 1 % EX CREA: TOPICAL_CREAM | CUTANEOUS | 1 refills | Status: AC

## 2017-11-27 MED ORDER — SILVER SULFADIAZINE 1 % EX CREA
TOPICAL_CREAM | CUTANEOUS | Status: AC
  Filled 2017-11-27: qty 85

## 2017-11-27 MED ORDER — TRAMADOL HCL 50 MG PO TABS: 50.0000 mg | ORAL_TABLET | Freq: Three times a day (TID) | ORAL | 0 refills | Status: DC | PRN

## 2017-11-27 MED ORDER — SILVER SULFADIAZINE 1 % EX CREA
TOPICAL_CREAM | Freq: Once | CUTANEOUS | Status: AC
  Administered 2017-11-27: 16:00:00 via TOPICAL

## 2017-11-27 MED ORDER — CLINDAMYCIN HCL 150 MG PO CAPS
300.0000 mg | ORAL_CAPSULE | Freq: Once | ORAL | Status: AC
  Administered 2017-11-27: 300 mg via ORAL
  Filled 2017-11-27: qty 2

## 2017-11-27 NOTE — ED Triage Notes (Signed)
Pt reports pain comes and goes but when it hits, it is severe. Pt family reports for him to want to come to the ED it must be bad.

## 2017-11-27 NOTE — ED Triage Notes (Signed)
Pt family reports that pt had a blister on the top of his left foot. Reports PMD is aware and advised pt to just let it burst on his own. Pt family reports area burst, and now left leg and foot is more swollen and painful. Pt with large open areas noted to top of left foot.

## 2017-11-27 NOTE — ED Notes (Signed)
Pt taken to POV in WC. Discharge instructions, RX and follow up reviewed. All questions answered. VSS. NAD.

## 2017-11-27 NOTE — Discharge Instructions (Signed)
You have been seen today in the Emergency Department (ED) for cellulitis, a superficial skin infection. Please take your antibiotics as prescribed for their ENTIRE prescribed duration. ° °Please follow up with your doctor or in the ED in 24-48 hours for recheck of your infection if you are not improving.  Call your doctor sooner or return to the ED if you develop worsening signs of infection such as: increased redness, increased pain, pus, fever, or other symptoms that concern you. ° °

## 2017-11-27 NOTE — ED Provider Notes (Signed)
[facility] Emergency Department Provider Note   ____________________________________________   First MD Initiated Contact with Patient 11/27/17 1300     (approximate)  I have reviewed the triage vital signs and the nursing notes.   HISTORY  Chief Complaint Foot Pain    HPI Bobby Francis is a 84 y.o. male presents for evaluation of left foot blister and redness  Patient was recently admitted for a evaluation by neurology for weakness in his left arm.  Available the time he was hospitalized he started to develop a blister over his left lower foot which she reports was present before hospitalization as well.  He followed up with his doctor and had a neurology visit today, he reports the blister popped yesterday and is having redness discomfort across top of the left foot.  No fever.  He is able to walk on her, but is tender across top of foot.  Blister popped yesterday.  Some slight redness in the area of his toes.  Some minimal swelling is noticed in his left lower leg since leaving the hospital.  No abdominal pain.  No chills.  No change in fatigue or weakness. Past Medical History:  Diagnosis Date  . B12 deficiency   . CHF (congestive heart failure) (HCC)   . COPD (chronic obstructive pulmonary disease) (HCC)   . HTN (hypertension)   . S/P AAA repair   . Tobacco abuse     Patient Active Problem List   Diagnosis Date Noted  . Protein-calorie malnutrition, severe 11/22/2017  . CVA (cerebral vascular accident) (HCC) 11/21/2017  . Cerebrovascular accident (CVA) (HCC)   . CAD in native artery 08/07/2015  . Chronic idiopathic thrombocytopenic purpura (HCC) 08/07/2015  . Achalasia of the cricopharyngeus muscle 08/07/2015  . Essential (primary) hypertension 08/07/2015  . Controlled gout 08/07/2015  . Microscopic hematuria 08/07/2015  . Hyperlipidemia 08/07/2015  . Degenerative arthritis of hip 08/07/2015  . Senile purpura (HCC) 08/07/2015  .  Compulsive tobacco user syndrome 08/07/2015    Past Surgical History:  Procedure Laterality Date  . ABDOMINAL AORTIC ANEURYSM REPAIR  2011  . Aortoiliac femoral bypass  2011  . CHOLECYSTECTOMY    . Lumbar back surgery  2015  . PACEMAKER INSERTION     For a flutter and sick sinus syndrome  . PTCA and stent  2005    Prior to Admission medications   Medication Sig Start Date End Date Taking? Authorizing Provider  allopurinol (ZYLOPRIM) 300 MG tablet Take 1 tablet by mouth daily. 01/27/15  Yes [provider]  aspirin 81 MG chewable tablet Chew 1 tablet by mouth daily.   Yes [provider]  donepezil (ARICEPT) 10 MG tablet Take 10 mg by mouth at bedtime.   Yes [provider]  furosemide (LASIX) 40 MG tablet Take 40 mg by mouth daily.   Yes [provider]  gabapentin (NEURONTIN) 300 MG capsule TAKE ONE CAPSULE BY MOUTH TWICE DAILY 08/07/15  Yes Reubin Milan, MD  losartan (COZAAR) 50 MG tablet Take 50 mg by mouth daily.   Yes [provider]  memantine (NAMENDA) 10 MG tablet Take 10 mg by mouth 2 (two) times daily.   Yes [provider]  pravastatin (PRAVACHOL) 80 MG tablet Take 1 tablet by mouth daily. 08/02/14  Yes [provider]  clindamycin (CLEOCIN) 300 MG capsule Take 1 capsule (300 mg total) by mouth 3 (three) times daily. 11/27/17   Sharyn Creamer, MD  silver sulfADIAZINE (SILVADENE) 1 %  cream Apply to affected area daily 11/27/17   Sharyn Creamer, MD  traMADol (ULTRAM) 50 MG tablet Take 1 tablet (50 mg total) by mouth every 8 (eight) hours as needed. 11/27/17   Sharyn Creamer, MD    Allergies Patient has no known allergies.  Family History  Problem Relation Age of Onset  . Cervical cancer Mother   . CAD Brother     Social History Social History   Tobacco Use  . Smoking status: Current Every Day Smoker  . Smokeless tobacco: Never Used  Substance Use Topics  . Alcohol use: No    Alcohol/week: 0.0 oz  . Drug use:  Not on file    Review of Systems Constitutional: No fever/chills Eyes: No visual changes. ENT: No sore throat. Cardiovascular: Denies chest pain. Respiratory: Denies shortness of breath. Gastrointestinal: No abdominal pain.  No nausea, no vomiting.  No diarrhea.  No constipation. Genitourinary: Negative for dysuria. Musculoskeletal: Negative for back pain. Skin: See HPI Neurological: Negative for headaches, focal weakness or numbness except in his left upper arm and he is been admitted and following in neurology for this without significant change.    ____________________________________________   PHYSICAL EXAM:  VITAL SIGNS: ED Triage Vitals  Enc Vitals Group     BP 11/27/17 1033 (!) 108/58     Pulse Rate 11/27/17 1033 85     Resp 11/27/17 1033 18     Temp 11/27/17 1033 97.8 F (36.6 C)     Temp Source 11/27/17 1033 Oral     SpO2 11/27/17 1033 91 %     Weight 11/27/17 1049 148 lb (67.1 kg)     Height 11/27/17 1049 5\' 8"  (1.727 m)     Head Circumference --      Peak Flow --      Pain Score 11/27/17 1048 0     Pain Loc --      Pain Edu? --      Excl. in GC? --     Constitutional: Alert and oriented. Well appearing and in no acute distress. Eyes: Conjunctivae are normal. Head: Atraumatic. Nose: No congestion/rhinnorhea. Mouth/Throat: Mucous membranes are moist. Neck: No stridor.   Cardiovascular: Normal rate, regular rhythm. Grossly normal heart sounds.  Good peripheral circulation. Respiratory: Normal respiratory effort.  No retractions. Lungs CTAB. Gastrointestinal: Soft and nontender. No distention. Musculoskeletal:   Lower Extremities  No edema. Normal DP/PT pulses bilateral with good cap refill.  Normal neuro-motor function lower extremities bilateral.  RIGHT Right lower extremity demonstrates normal strength, good use of all muscles. No edema bruising or contusions of the right hip, right knee, right ankle. Full range of motion of the right lower  extremity without pain. No pain on axial loading. No evidence of trauma.  LEFT Left lower extremity demonstrates normal strength, good use of all muscles. No edema bruising or contusions of the hip,  knee, ankle. Full range of motion of the left lower extremity but with some pain discomfort over the dorsum of the left foot. No pain on axial loading.  The patient is a bulla that is popped over the left lower foot, there is some slight redness erythema and tenderness extending over the dorsum of the foot towards the toes.  No necrosis.  No eschar.  Capillary refill normal in all toes.  Strong pulses.  No erythema tracking up the remaining leg.  Some mild edema in the left lower extremity   Neurologic:  Normal speech and language. No gross focal neurologic deficits  are appreciated except for some weakness in the left upper extremity which she reports is ongoing since hospitalization and following up with neurology.  Skin:  Skin is warm, dry and intact. Psychiatric: Mood and affect are normal. Speech and behavior are normal.  ____________________________________________   LABS (all labs ordered are listed, but only abnormal results are displayed)  Labs Reviewed  COMPREHENSIVE METABOLIC PANEL - Abnormal; Notable for the following components:      Result Value   Potassium 3.4 (*)    Chloride 98 (*)    CO2 33 (*)    Glucose, Bld 126 (*)    Creatinine, Ser 1.30 (*)    Calcium 8.8 (*)    Albumin 3.3 (*)    ALT 15 (*)    GFR calc non Af Amer 48 (*)    GFR calc Af Amer 56 (*)    All other components within normal limits  CBC WITH DIFFERENTIAL/PLATELET - Abnormal; Notable for the following components:   RBC 4.38 (*)    Platelets 96 (*)    Lymphs Abs 0.9 (*)    All other components within normal limits   ____________________________________________  EKG   ____________________________________________  RADIOLOGY  [place] Venous Img Lower Unilateral Left  Result Date: 11/27/2017 CLINICAL  DATA:  Left foot pain and swelling, initial encounter EXAM: LEFT LOWER EXTREMITY VENOUS DOPPLER ULTRASOUND TECHNIQUE: Gray-scale sonography with graded compression, as well as color Doppler and duplex ultrasound were performed to evaluate the lower extremity deep venous systems from the level of the common femoral vein and including the common femoral, femoral, profunda femoral, popliteal and calf veins including the posterior tibial, peroneal and gastrocnemius veins when visible. The superficial great saphenous vein was also interrogated. Spectral Doppler was utilized to evaluate flow at rest and with distal augmentation maneuvers in the common femoral, femoral and popliteal veins. COMPARISON:  11/19/2017 FINDINGS: Contralateral Common Femoral Vein: Respiratory phasicity is normal and symmetric with the symptomatic side. No evidence of thrombus. Normal compressibility. Common Femoral Vein: No evidence of thrombus. Normal compressibility, respiratory phasicity and response to augmentation. Saphenofemoral Junction: No evidence of thrombus. Normal compressibility and flow on color Doppler imaging. Profunda Femoral Vein: No evidence of thrombus. Normal compressibility and flow on color Doppler imaging. Femoral Vein: No evidence of thrombus. Normal compressibility, respiratory phasicity and response to augmentation. Popliteal Vein: No evidence of thrombus. Normal compressibility, respiratory phasicity and response to augmentation. Calf Veins: No evidence of thrombus. Normal compressibility and flow on color Doppler imaging. Superficial Great Saphenous Vein: No evidence of thrombus. Normal compressibility. Venous Reflux:  None. Other Findings: Incidental note is made of aneurysmal dilatation of the right common femoral artery. It measures approximately 2 cm in greatest dimension. This was not as well appreciated on the prior exam. IMPRESSION: No evidence of deep venous thrombosis in the left lower extremity. Aneurysmal  dilatation is noted in the right common femoral artery. This has progressed from a prior CT examination dated 04/19/2010 Electronically Signed   By: Alcide CleverMark  Lukens M.D.   On: 11/27/2017 15:27   Dg Foot Complete Left  Result Date: 11/27/2017 CLINICAL DATA:  Left dorsal foot ulcer EXAM: LEFT FOOT - COMPLETE 3+ VIEW COMPARISON:  None. FINDINGS: There is no evidence of fracture or dislocation. There is no evidence of arthropathy or other focal bone abnormality. Soft tissues are unremarkable. Skin ulceration dorsal foot over the metatarsals. Arterial calcification. IMPRESSION: No acute bony abnormality. Electronically Signed   By: Marlan Palauharles  Clark M.D.   On: 11/27/2017  14:56    X-ray of the left foot reviewed, no acute Left lower leg no DVT, aneurysmal dilatation of the right femoral artery. ____________________________________________   PROCEDURES  Procedure(s) performed: None  Procedures  Critical Care performed: No  ____________________________________________   INITIAL IMPRESSION / ASSESSMENT AND PLAN / ED COURSE  Pertinent labs & imaging results that were available during my care of the patient were reviewed by me and considered in my medical decision making (see chart for details).  Patient is for evaluation of left foot pain with a blister that was likely a fluid blister that is now popped resulting in some erythema redness pain but no associated systemic symptoms.  Reassuring lab work and afebrile.  No signs or symptoms of sepsis.  Appears consistent with mild cellulitis associated with a recent.  Will treat with Silvadene for the blister, also place patient on clindamycin.  Discussed side effects of clindamycin including that he should seek medical care right away if he develops diarrhea, fever, increasing redness and pain around the foot or other concerns arise.  Patient will be following up with his primary doctor for further care coordination wound recheck.  Has active prescription  for tramadol at home which she reports is effective for relief of pain  Return precautions and treatment recommendations and follow-up discussed with the patient who is agreeable with the plan.       ____________________________________________   FINAL CLINICAL IMPRESSION(S) / ED DIAGNOSES  Final diagnoses:  Cellulitis of left foot  Femoral artery aneurysm, right (HCC)  Blister of left foot, initial encounter      NEW MEDICATIONS STARTED DURING THIS VISIT:  New Prescriptions   CLINDAMYCIN (CLEOCIN) 300 MG CAPSULE    Take 1 capsule (300 mg total) by mouth 3 (three) times daily.   SILVER SULFADIAZINE (SILVADENE) 1 % CREAM    Apply to affected area daily   TRAMADOL (ULTRAM) 50 MG TABLET    Take 1 tablet (50 mg total) by mouth every 8 (eight) hours as needed.     Note:  This document was prepared using Dragon voice recognition software and may include unintentional dictation errors.     Sharyn Creamer, MD 11/27/17 [phone]

## 2017-12-04 NOTE — Discharge Summary (Signed)
[facility] - [place] at [facility]   PATIENT NAME: Bobby Francis    MR#:  [id number]  DATE OF BIRTH:  December 12, 1928  DATE OF ADMISSION:  11/21/2017 ADMITTING PHYSICIAN: Milagros LollSrikar Brailen Macneal, MD  DATE OF DISCHARGE: 11/22/2017  3:05 PM  PRIMARY CARE PHYSICIAN: Mickey Farberhies, David, MD   ADMISSION DIAGNOSIS:  CVA (cerebral vascular accident) [facility](HCC) [I63.9] Left arm weakness [R29.898] Cerebral infarction, unspecified mechanism (HCC) [I63.9]  DISCHARGE DIAGNOSIS:  Active Problems:   CVA (cerebral vascular accident) (HCC)   Protein-calorie malnutrition, severe   SECONDARY DIAGNOSIS:   Past Medical History:  Diagnosis Date  . B12 deficiency   . CHF (congestive heart failure) (HCC)   . COPD (chronic obstructive pulmonary disease) (HCC)   . HTN (hypertension)   . S/P AAA repair   . Tobacco abuse      ADMITTING HISTORY  HISTORY OF PRESENT ILLNESS:  Bobby Francis  is a 84 y.o. male with a known history of hypertension, diabetes, COPD, CHF, atrial flutter with pacemaker presents to the emergency room complaining of acute onset of left arm numbness and weakness.  More pronounced in his left hand.  Seen by neurology through code stroke and advised admission for further workup.  Patient smokes.  No history of stroke.  Not on any anticoagulation. No other focal weakness or trouble swallowing.  No change in speech.  Has mild dementia. History mainly obtained from wife. Outside TPA window at this time.  No DP advised by neurology.   HOSPITAL COURSE:   *Left wrist drop.  Patient was admitted to medical floor due to concern for stroke.  CT scan of the head was repeated in 24 hours which showed nothing acute. MRI could not be done due to permanent pacemaker.  Discussed with Dr. Loretha BrasilZeylikman of neurology who is thought this was peripheral neuropathy symptom with wrist drop.  Unlikely to be stroke.  Patient was discharged home with occupational therapy.  Follow-up with neurology as  outpatient.  CONSULTS OBTAINED:  Treatment Team:  Pauletta BrownsZeylikman, Yuriy, MD  DRUG ALLERGIES:  No Known Allergies  DISCHARGE MEDICATIONS:   Allergies as of 11/22/2017   No Known Allergies     Medication List    TAKE these medications   allopurinol 300 MG tablet Commonly known as:  ZYLOPRIM Take 1 tablet by mouth daily.   aspirin 81 MG chewable tablet Chew 1 tablet by mouth daily.   donepezil 10 MG tablet Commonly known as:  ARICEPT Take 10 mg by mouth at bedtime.   furosemide 40 MG tablet Commonly known as:  LASIX Take 40 mg by mouth daily.   gabapentin 300 MG capsule Commonly known as:  NEURONTIN TAKE ONE CAPSULE BY MOUTH TWICE DAILY   losartan 50 MG tablet Commonly known as:  COZAAR Take 50 mg by mouth daily.   memantine 10 MG tablet Commonly known as:  NAMENDA Take 10 mg by mouth 2 (two) times daily.   pravastatin 80 MG tablet Commonly known as:  PRAVACHOL Take 1 tablet by mouth daily.       Today   VITAL SIGNS:  Blood pressure (!) 143/67, pulse 72, temperature 97.7 F (36.5 C), temperature source Oral, resp. rate 18, height 5\' 8"  (1.727 m), weight 68.1 kg (150 lb 3.2 oz), SpO2 96 %.  I/O:  No intake or output data in the 24 hours ending 12/04/17 1719  PHYSICAL EXAMINATION:  Physical Exam  GENERAL:  84 y.o.-year-old patient lying in the bed with no acute distress.  LUNGS: Normal  breath sounds bilaterally, no wheezing, rales,rhonchi or crepitation. No use of accessory muscles of respiration.  CARDIOVASCULAR: S1, S2 normal. No murmurs, rubs, or gallops.  ABDOMEN: Soft, non-tender, non-distended. Bowel sounds present. No organomegaly or mass.  NEUROLOGIC: Moves all 4 extremities. PSYCHIATRIC: The patient is alert and oriented x 3.  SKIN: No obvious rash, lesion, or ulcer.   DATA REVIEW:   CBC No results for input(s): WBC, HGB, HCT, PLT in the last 168 hours.  Chemistries  No results for input(s): NA, K, CL, CO2, GLUCOSE, BUN, CREATININE, CALCIUM,  MG, AST, ALT, ALKPHOS, BILITOT in the last 168 hours.  Invalid input(s): GFRCGP  Cardiac Enzymes No results for input(s): TROPONINI in the last 168 hours.  Microbiology Results  No results found for this or any previous visit.  RADIOLOGY:  No results found.  Follow up with PCP in 1 week.  Management plans discussed with the patient, family and they are in agreement.  CODE STATUS:  Code Status History    Date Active Date Inactive Code Status Order ID Comments User Context   11/21/2017 12:20 11/22/2017 18:05 Full Code [id number]  Milagros Loll, MD ED      TOTAL TIME TAKING CARE OF THIS PATIENT ON DAY OF DISCHARGE: more than 30 minutes.   Molinda Bailiff Waynette Towers M.D on 12/04/2017 at 5:19 PM  Between 7am to 6pm - Pager - [phone]  After 6pm go to [email or web address] - password [email or web address] [facility]  [facility]  Office  [phone]  CC: Primary care physician; Mickey Farber, MD  Note: This dictation was prepared with Dragon dictation along with smaller phrase technology. Any transcriptional errors that result from this process are unintentional.

## 2018-06-04 DEATH — deceased

## 2018-12-10 IMAGING — CT CT HEAD CODE STROKE
3 series · 15 of 47 positions shown, 18 images · non-contrast
Comparison: None.

CLINICAL DATA: Code stroke. Acute onset of left-sided weakness and
ataxia beginning 2 hours ago.

EXAM:
CT HEAD WITHOUT CONTRAST
TECHNIQUE: Contiguous axial images were obtained from the base of the skull
through the vertex without intravenous contrast.

[Series 2: head wo · axial · 0.43mm/px · z∈[-168,-33]mm · 9 of 33 slices shown, 12 images]
[im 3/33  brain]
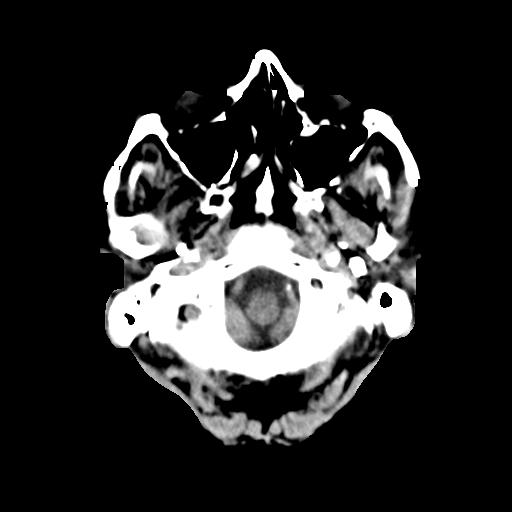
[im 3/33  bone]
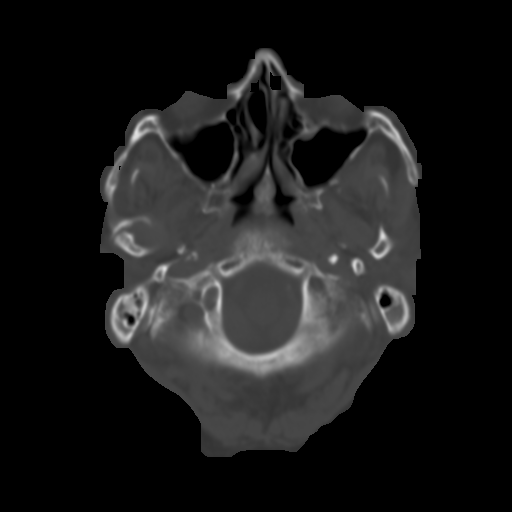
[im 6/33  brain]
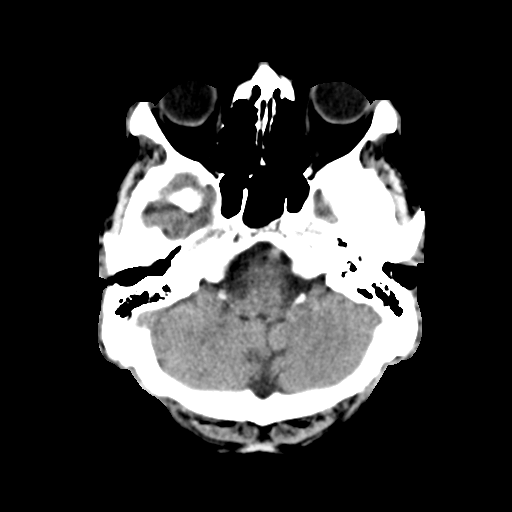
[im 9/33  brain]
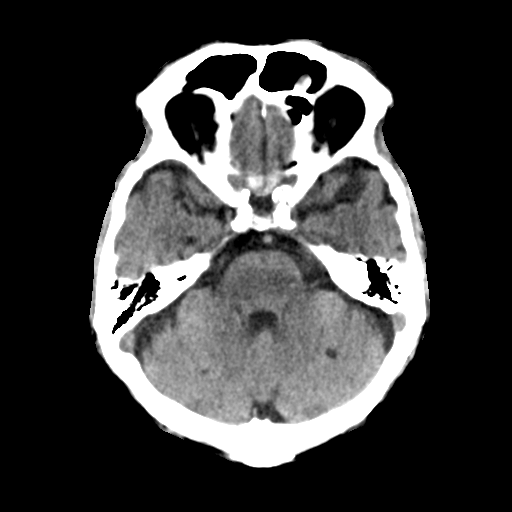
[im 13/33  brain]
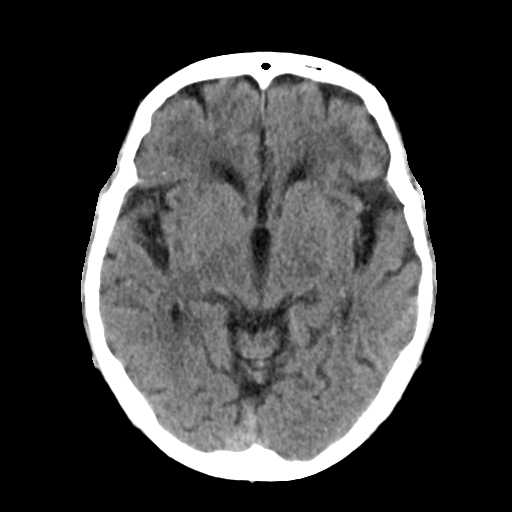
[im 17/33  brain]
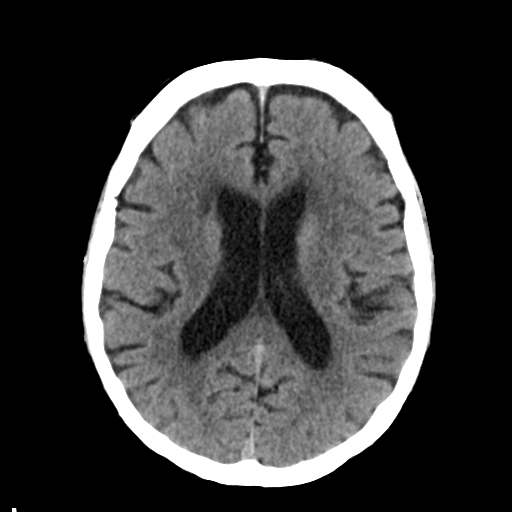
[im 17/33  bone]
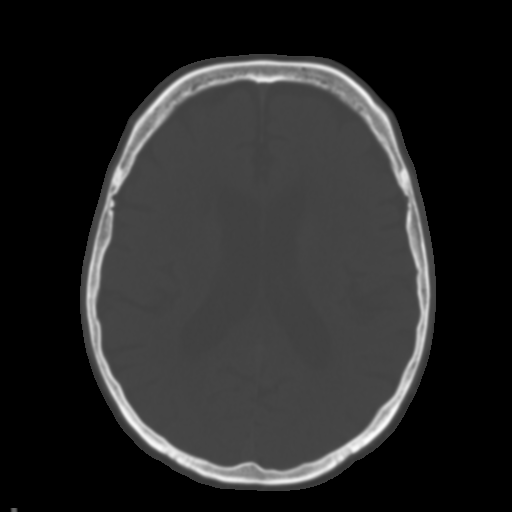
[im 20/33  brain]
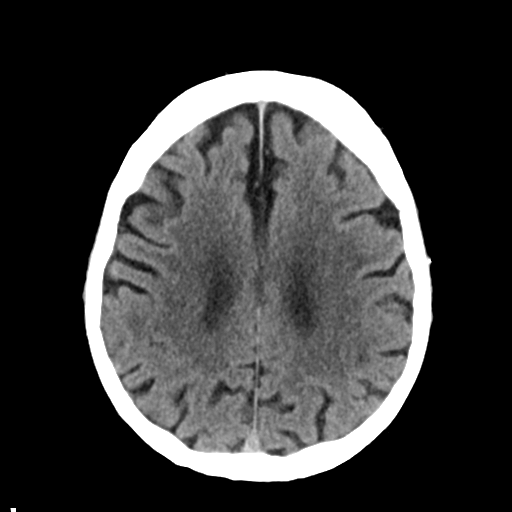
[im 24/33  brain]
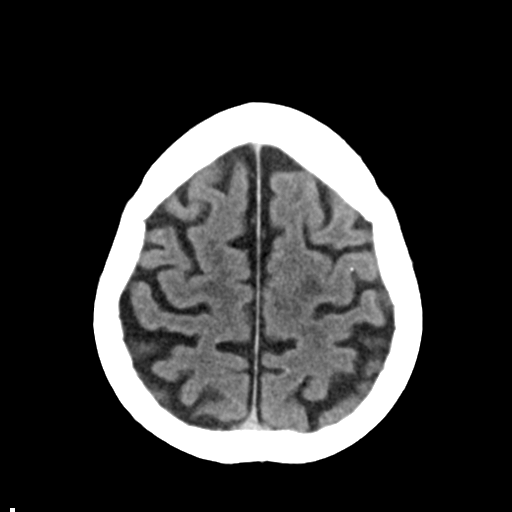
[im 27/33  brain]
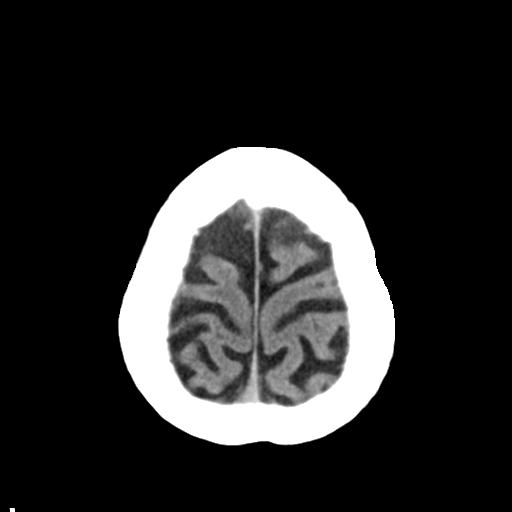
[im 30/33  brain]
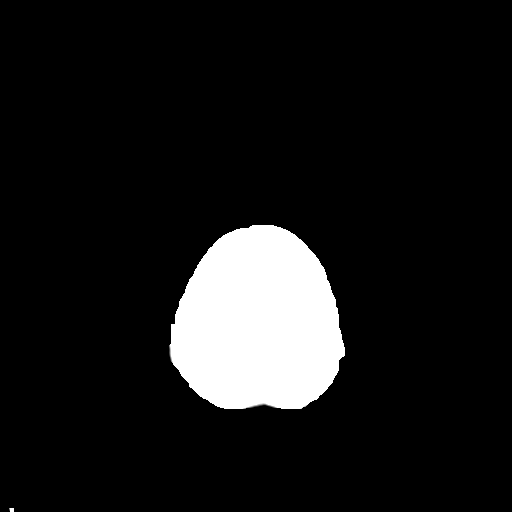
[im 30/33  bone]
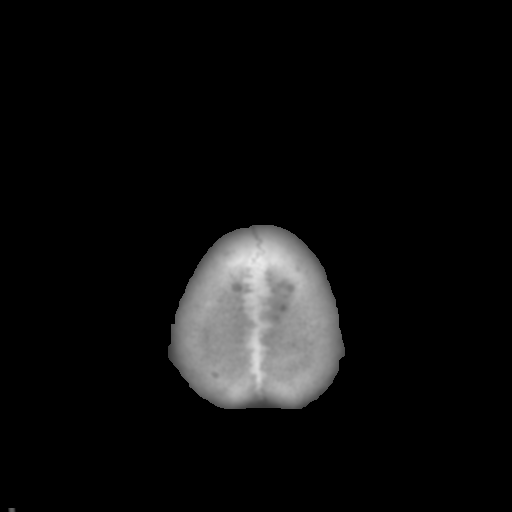

[Series 4: coronal soft tissue · coronal · 0.33mm/px · 3 of 66 slices shown]
[im 22/66  brain]
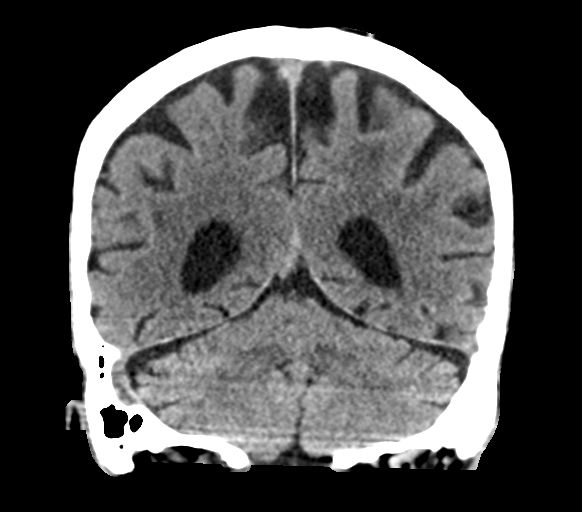
[im 29/66  brain]
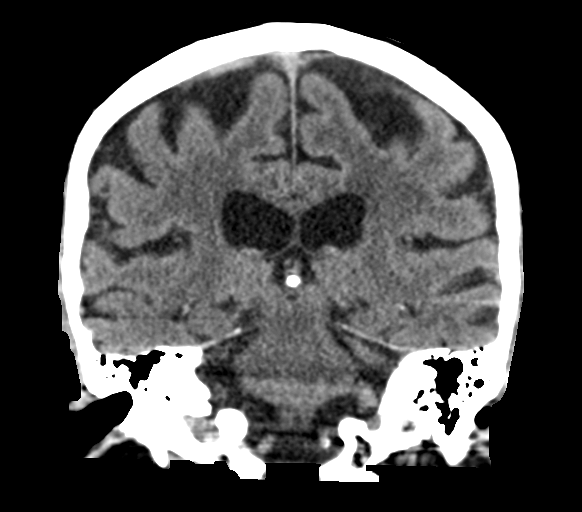
[im 37/66  brain]
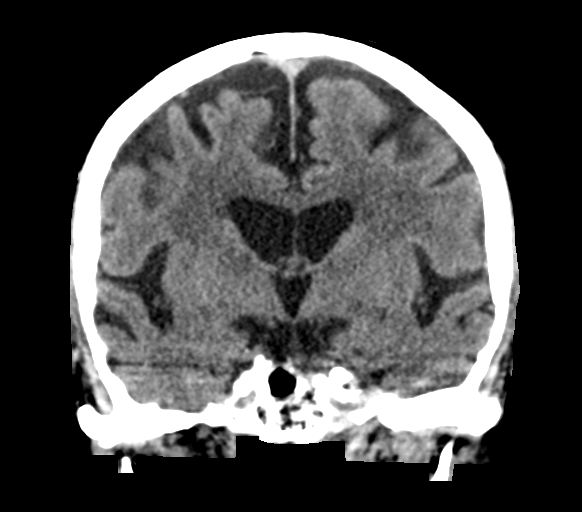

[Series 5: sagittal soft tissue · sagittal · 0.34mm/px · 3 of 56 slices shown]
[im 19/56  brain]
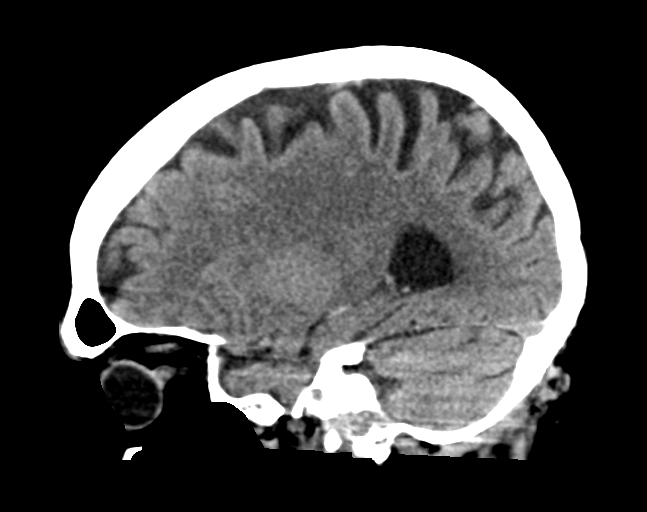
[im 28/56  brain]
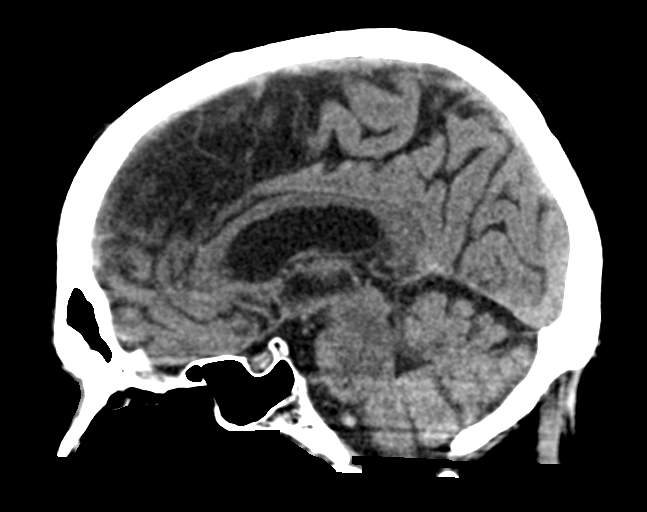
[im 37/56  brain]
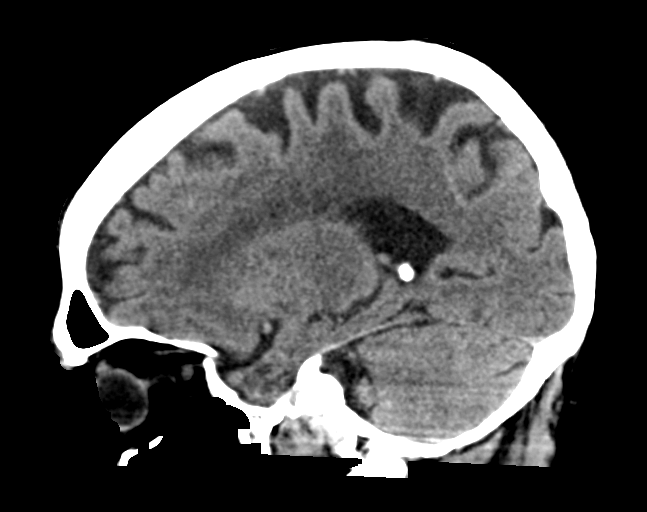

[15 of 47 positions shown; findings below may reference images not displayed]

FINDINGS: Brain: Moderate generalized atrophy and white matter disease is
present bilaterally. The basal ganglia are intact. The insular
ribbon is normal. No acute or focal cortical abnormality is present
bilaterally. No acute hemorrhage or mass lesion is present. The
ventricles are proportionate to the degree of atrophy. No
significant extra-axial fluid collection is present.

Bilateral remote lacunar infarcts are present in the cerebellum. No
acute infarct is present. The brainstem is within normal limits.

Vascular: Dense atherosclerotic calcifications are present within
the cavernous internal carotid arteries bilaterally. Calcifications
are also present at the dural margin of both vertebral arteries.
There is no hyperdense vessel.

Skull: The calvarium is intact. No focal lytic or blastic lesions
are present.

Sinuses/Orbits: The paranasal sinuses and mastoid air cells are
clear. Globes and orbits are within normal limits.

ASPECTS (Alberta Stroke Program Early CT Score)

- Ganglionic level infarction (caudate, lentiform nuclei, internal
capsule, insula, M1-M3 cortex): [DATE]

- Supraganglionic infarction (M4-M6 cortex): [DATE]

Total score (0-10 with 10 being normal): [DATE]
IMPRESSION: 1. No acute intracranial abnormality.
2. Moderate diffuse atrophy and white matter disease likely reflects
the sequela of chronic microvascular ischemia.
3. Extensive atherosclerotic changes without a hyperdense vessel.
4. ASPECTS is [DATE]

These results were called by telephone at the time of interpretation
on 11/21/2017 at [DATE] to Dr. Nas, who verbally
acknowledged these results.
